# Patient Record
Sex: Female | Born: 1960 | Race: Black or African American | Hispanic: No | Smoking: Never smoker
Health system: Southern US, Community
[De-identification: ages and names within clinical notes are randomized; demographics above are authoritative.]

## PROBLEM LIST (undated history)

## (undated) DIAGNOSIS — M1990 Unspecified osteoarthritis, unspecified site: Secondary | ICD-10-CM

## (undated) DIAGNOSIS — F32A Depression, unspecified: Secondary | ICD-10-CM

## (undated) DIAGNOSIS — C50919 Malignant neoplasm of unspecified site of unspecified female breast: Secondary | ICD-10-CM

## (undated) DIAGNOSIS — I2699 Other pulmonary embolism without acute cor pulmonale: Secondary | ICD-10-CM

## (undated) DIAGNOSIS — Z923 Personal history of irradiation: Secondary | ICD-10-CM

## (undated) DIAGNOSIS — F419 Anxiety disorder, unspecified: Secondary | ICD-10-CM

## (undated) DIAGNOSIS — I509 Heart failure, unspecified: Secondary | ICD-10-CM

## (undated) DIAGNOSIS — I10 Essential (primary) hypertension: Secondary | ICD-10-CM

## (undated) DIAGNOSIS — F329 Major depressive disorder, single episode, unspecified: Secondary | ICD-10-CM

## (undated) DIAGNOSIS — J449 Chronic obstructive pulmonary disease, unspecified: Secondary | ICD-10-CM

## (undated) DIAGNOSIS — K219 Gastro-esophageal reflux disease without esophagitis: Secondary | ICD-10-CM

## (undated) DIAGNOSIS — E785 Hyperlipidemia, unspecified: Secondary | ICD-10-CM

## (undated) DIAGNOSIS — E119 Type 2 diabetes mellitus without complications: Secondary | ICD-10-CM

## (undated) DIAGNOSIS — Z9221 Personal history of antineoplastic chemotherapy: Secondary | ICD-10-CM

## (undated) DIAGNOSIS — J45909 Unspecified asthma, uncomplicated: Secondary | ICD-10-CM

## (undated) HISTORY — DX: Anxiety disorder, unspecified: F41.9

## (undated) HISTORY — DX: Essential (primary) hypertension: I10

## (undated) HISTORY — DX: Unspecified osteoarthritis, unspecified site: M19.90

## (undated) HISTORY — DX: Heart failure, unspecified: I50.9

## (undated) HISTORY — DX: Major depressive disorder, single episode, unspecified: F32.9

## (undated) HISTORY — DX: Unspecified asthma, uncomplicated: J45.909

## (undated) HISTORY — DX: Depression, unspecified: F32.A

## (undated) HISTORY — PX: ABDOMINAL HYSTERECTOMY: SHX81

## (undated) HISTORY — DX: Other pulmonary embolism without acute cor pulmonale: I26.99

## (undated) HISTORY — PX: TUBAL LIGATION: SHX77

## (undated) HISTORY — DX: Chronic obstructive pulmonary disease, unspecified: J44.9

## (undated) HISTORY — DX: Hyperlipidemia, unspecified: E78.5

## (undated) HISTORY — DX: Type 2 diabetes mellitus without complications: E11.9

## (undated) HISTORY — DX: Malignant neoplasm of unspecified site of unspecified female breast: C50.919

## (undated) HISTORY — DX: Gastro-esophageal reflux disease without esophagitis: K21.9

---

## 2001-12-03 HISTORY — PX: BREAST EXCISIONAL BIOPSY: SUR124

## 2004-12-03 HISTORY — PX: MASTECTOMY: SHX3

## 2004-12-03 HISTORY — PX: BREAST BIOPSY: SHX20

## 2004-12-26 ENCOUNTER — Emergency Department: Payer: Self-pay | Admitting: Emergency Medicine

## 2004-12-29 ENCOUNTER — Ambulatory Visit: Payer: Self-pay | Admitting: Oncology

## 2005-01-03 ENCOUNTER — Ambulatory Visit: Payer: Self-pay | Admitting: Oncology

## 2005-02-01 ENCOUNTER — Ambulatory Visit: Payer: Self-pay | Admitting: Oncology

## 2005-03-03 ENCOUNTER — Ambulatory Visit: Payer: Self-pay | Admitting: Oncology

## 2005-03-04 ENCOUNTER — Emergency Department: Payer: Self-pay | Admitting: Emergency Medicine

## 2005-03-09 ENCOUNTER — Ambulatory Visit: Payer: Self-pay | Admitting: Surgery

## 2005-04-23 ENCOUNTER — Inpatient Hospital Stay: Payer: Self-pay | Admitting: Surgery

## 2005-04-25 ENCOUNTER — Other Ambulatory Visit: Payer: Self-pay

## 2005-05-09 ENCOUNTER — Inpatient Hospital Stay: Payer: Self-pay | Admitting: Oncology

## 2005-05-16 ENCOUNTER — Ambulatory Visit: Payer: Self-pay

## 2005-05-16 ENCOUNTER — Ambulatory Visit: Payer: Self-pay | Admitting: Oncology

## 2005-06-02 ENCOUNTER — Ambulatory Visit: Payer: Self-pay

## 2005-06-02 ENCOUNTER — Ambulatory Visit: Payer: Self-pay | Admitting: Oncology

## 2005-06-11 ENCOUNTER — Emergency Department: Payer: Self-pay | Admitting: Surgery

## 2005-07-03 ENCOUNTER — Ambulatory Visit: Payer: Self-pay | Admitting: Oncology

## 2005-08-02 ENCOUNTER — Other Ambulatory Visit: Payer: Self-pay

## 2005-08-03 ENCOUNTER — Ambulatory Visit: Payer: Self-pay | Admitting: Oncology

## 2005-08-09 ENCOUNTER — Ambulatory Visit: Payer: Self-pay | Admitting: General Surgery

## 2005-09-02 ENCOUNTER — Ambulatory Visit: Payer: Self-pay | Admitting: Oncology

## 2005-10-03 ENCOUNTER — Ambulatory Visit: Payer: Self-pay | Admitting: Oncology

## 2005-11-02 ENCOUNTER — Ambulatory Visit: Payer: Self-pay | Admitting: Oncology

## 2005-12-03 ENCOUNTER — Ambulatory Visit: Payer: Self-pay | Admitting: Oncology

## 2006-01-03 ENCOUNTER — Ambulatory Visit: Payer: Self-pay | Admitting: Oncology

## 2006-01-21 ENCOUNTER — Emergency Department: Payer: Self-pay | Admitting: Emergency Medicine

## 2006-01-31 ENCOUNTER — Ambulatory Visit: Payer: Self-pay | Admitting: Oncology

## 2006-03-03 ENCOUNTER — Ambulatory Visit: Payer: Self-pay | Admitting: Oncology

## 2006-04-02 ENCOUNTER — Ambulatory Visit: Payer: Self-pay | Admitting: Oncology

## 2006-04-05 ENCOUNTER — Ambulatory Visit: Payer: Self-pay

## 2006-05-03 ENCOUNTER — Ambulatory Visit: Payer: Self-pay | Admitting: Oncology

## 2006-06-02 ENCOUNTER — Ambulatory Visit: Payer: Self-pay | Admitting: Oncology

## 2006-06-27 ENCOUNTER — Inpatient Hospital Stay: Payer: Self-pay | Admitting: Obstetrics and Gynecology

## 2006-07-03 ENCOUNTER — Ambulatory Visit: Payer: Self-pay | Admitting: Oncology

## 2006-07-09 ENCOUNTER — Ambulatory Visit: Payer: Self-pay | Admitting: Internal Medicine

## 2006-08-03 ENCOUNTER — Ambulatory Visit: Payer: Self-pay | Admitting: Oncology

## 2006-10-03 ENCOUNTER — Ambulatory Visit: Payer: Self-pay | Admitting: Oncology

## 2006-11-02 ENCOUNTER — Ambulatory Visit: Payer: Self-pay | Admitting: Oncology

## 2006-12-03 ENCOUNTER — Ambulatory Visit: Payer: Self-pay | Admitting: Oncology

## 2007-02-12 ENCOUNTER — Ambulatory Visit: Payer: Self-pay | Admitting: Oncology

## 2007-02-18 ENCOUNTER — Ambulatory Visit: Payer: Self-pay | Admitting: Internal Medicine

## 2007-03-04 ENCOUNTER — Ambulatory Visit: Payer: Self-pay | Admitting: Unknown Physician Specialty

## 2007-03-04 ENCOUNTER — Ambulatory Visit: Payer: Self-pay | Admitting: Oncology

## 2007-03-05 ENCOUNTER — Ambulatory Visit: Payer: Self-pay | Admitting: Unknown Physician Specialty

## 2007-05-13 ENCOUNTER — Ambulatory Visit: Payer: Self-pay | Admitting: Oncology

## 2007-05-15 ENCOUNTER — Ambulatory Visit: Payer: Self-pay | Admitting: Oncology

## 2007-06-03 ENCOUNTER — Ambulatory Visit: Payer: Self-pay | Admitting: Oncology

## 2007-08-04 ENCOUNTER — Ambulatory Visit: Payer: Self-pay | Admitting: Oncology

## 2007-08-15 ENCOUNTER — Ambulatory Visit: Payer: Self-pay | Admitting: Oncology

## 2007-09-03 ENCOUNTER — Ambulatory Visit: Payer: Self-pay | Admitting: Oncology

## 2007-09-24 ENCOUNTER — Ambulatory Visit: Payer: Self-pay | Admitting: Family Medicine

## 2007-11-03 ENCOUNTER — Ambulatory Visit: Payer: Self-pay | Admitting: Oncology

## 2007-11-07 ENCOUNTER — Ambulatory Visit: Payer: Self-pay | Admitting: Oncology

## 2007-12-04 ENCOUNTER — Ambulatory Visit: Payer: Self-pay | Admitting: Oncology

## 2008-01-04 ENCOUNTER — Ambulatory Visit: Payer: Self-pay | Admitting: Oncology

## 2008-04-02 ENCOUNTER — Ambulatory Visit: Payer: Self-pay | Admitting: Oncology

## 2008-07-03 ENCOUNTER — Ambulatory Visit: Payer: Self-pay | Admitting: Oncology

## 2008-12-14 ENCOUNTER — Ambulatory Visit: Payer: Self-pay | Admitting: Oncology

## 2009-02-10 ENCOUNTER — Encounter: Payer: Self-pay | Admitting: Internal Medicine

## 2009-03-03 ENCOUNTER — Encounter: Payer: Self-pay | Admitting: Internal Medicine

## 2009-05-03 ENCOUNTER — Ambulatory Visit: Payer: Self-pay | Admitting: Oncology

## 2009-05-30 ENCOUNTER — Ambulatory Visit: Payer: Self-pay | Admitting: Oncology

## 2009-06-02 ENCOUNTER — Ambulatory Visit: Payer: Self-pay | Admitting: Oncology

## 2009-07-01 ENCOUNTER — Ambulatory Visit: Payer: Self-pay | Admitting: Family Medicine

## 2009-07-03 ENCOUNTER — Ambulatory Visit: Payer: Self-pay | Admitting: Oncology

## 2009-12-03 ENCOUNTER — Ambulatory Visit: Payer: Self-pay | Admitting: Oncology

## 2009-12-15 ENCOUNTER — Ambulatory Visit: Payer: Self-pay | Admitting: Oncology

## 2009-12-29 ENCOUNTER — Ambulatory Visit: Payer: Self-pay | Admitting: Oncology

## 2010-01-03 ENCOUNTER — Ambulatory Visit: Payer: Self-pay | Admitting: Oncology

## 2010-05-03 ENCOUNTER — Ambulatory Visit: Payer: Self-pay | Admitting: Oncology

## 2010-05-10 ENCOUNTER — Ambulatory Visit: Payer: Self-pay | Admitting: Oncology

## 2010-06-02 ENCOUNTER — Ambulatory Visit: Payer: Self-pay | Admitting: Oncology

## 2010-07-03 ENCOUNTER — Ambulatory Visit: Payer: Self-pay | Admitting: Oncology

## 2011-03-21 ENCOUNTER — Emergency Department: Payer: Self-pay | Admitting: Emergency Medicine

## 2011-04-11 ENCOUNTER — Ambulatory Visit: Payer: Self-pay | Admitting: Family Medicine

## 2011-04-29 ENCOUNTER — Emergency Department: Payer: Self-pay | Admitting: Emergency Medicine

## 2011-05-25 ENCOUNTER — Ambulatory Visit: Payer: Self-pay | Admitting: Oncology

## 2011-05-26 LAB — CANCER ANTIGEN 27.29: CA 27.29: 14.9 U/mL (ref 0.0–38.6)

## 2011-06-03 ENCOUNTER — Ambulatory Visit: Payer: Self-pay | Admitting: Oncology

## 2011-07-04 ENCOUNTER — Ambulatory Visit: Payer: Self-pay | Admitting: Oncology

## 2011-08-02 ENCOUNTER — Ambulatory Visit: Payer: Self-pay | Admitting: Internal Medicine

## 2011-08-15 ENCOUNTER — Ambulatory Visit: Payer: Self-pay | Admitting: Internal Medicine

## 2011-12-25 ENCOUNTER — Ambulatory Visit: Payer: Self-pay | Admitting: Oncology

## 2011-12-25 LAB — CBC CANCER CENTER
Basophil #: 0.1 x10 3/mm (ref 0.0–0.1)
Eosinophil #: 0.2 x10 3/mm (ref 0.0–0.7)
HCT: 40.8 % (ref 35.0–47.0)
Lymphocyte %: 36 %
MCH: 25.8 pg — ABNORMAL LOW (ref 26.0–34.0)
MCHC: 33.3 g/dL (ref 32.0–36.0)
MCV: 78 fL — ABNORMAL LOW (ref 80–100)
Monocyte %: 6.8 %
Neutrophil #: 2.8 x10 3/mm (ref 1.4–6.5)
Platelet: 228 x10 3/mm (ref 150–440)
RDW: 15.5 % — ABNORMAL HIGH (ref 11.5–14.5)
WBC: 5.5 x10 3/mm (ref 3.6–11.0)

## 2011-12-25 LAB — COMPREHENSIVE METABOLIC PANEL
Alkaline Phosphatase: 106 U/L (ref 50–136)
Anion Gap: 6 — ABNORMAL LOW (ref 7–16)
BUN: 12 mg/dL (ref 7–18)
Bilirubin,Total: 0.5 mg/dL (ref 0.2–1.0)
Calcium, Total: 9.3 mg/dL (ref 8.5–10.1)
Chloride: 103 mmol/L (ref 98–107)
Co2: 32 mmol/L (ref 21–32)
Creatinine: 0.97 mg/dL (ref 0.60–1.30)
EGFR (African American): >60
EGFR (Non-African Amer.): >60
Glucose: 177 mg/dL — ABNORMAL HIGH (ref 65–99)
Osmolality: 285 (ref 275–301)
Potassium: 3.8 mmol/L (ref 3.5–5.1)
SGOT(AST): 15 U/L (ref 15–37)
Total Protein: 7.6 g/dL (ref 6.4–8.2)

## 2011-12-26 LAB — CANCER ANTIGEN 27.29: CA 27.29: 19.7 U/mL (ref 0.0–38.6)

## 2012-01-04 ENCOUNTER — Ambulatory Visit: Payer: Self-pay | Admitting: Oncology

## 2012-06-11 ENCOUNTER — Ambulatory Visit: Payer: Self-pay | Admitting: Gastroenterology

## 2012-06-19 ENCOUNTER — Ambulatory Visit: Payer: Self-pay | Admitting: Oncology

## 2012-06-25 ENCOUNTER — Ambulatory Visit: Payer: Self-pay | Admitting: Oncology

## 2012-06-25 LAB — COMPREHENSIVE METABOLIC PANEL
Albumin: 3.5 g/dL (ref 3.4–5.0)
Anion Gap: 6 — ABNORMAL LOW (ref 7–16)
Bilirubin,Total: 0.5 mg/dL (ref 0.2–1.0)
Calcium, Total: 9 mg/dL (ref 8.5–10.1)
Chloride: 103 mmol/L (ref 98–107)
EGFR (African American): >60
EGFR (Non-African Amer.): 54 — ABNORMAL LOW
Osmolality: 291 (ref 275–301)
Potassium: 3.9 mmol/L (ref 3.5–5.1)
Sodium: 140 mmol/L (ref 136–145)
Total Protein: 7.6 g/dL (ref 6.4–8.2)

## 2012-06-25 LAB — CBC CANCER CENTER
Basophil %: 0.8 %
Eosinophil #: 0.2 x10 3/mm (ref 0.0–0.7)
Eosinophil %: 3.5 %
HCT: 40.6 % (ref 35.0–47.0)
Lymphocyte %: 39.6 %
Monocyte %: 6.6 %
Neutrophil #: 3.1 x10 3/mm (ref 1.4–6.5)
Neutrophil %: 49.5 %
Platelet: 221 x10 3/mm (ref 150–440)
RBC: 5.19 10*6/uL (ref 3.80–5.20)
WBC: 6.4 x10 3/mm (ref 3.6–11.0)

## 2012-06-26 LAB — CANCER ANTIGEN 27.29: CA 27.29: 18.7 U/mL (ref 0.0–38.6)

## 2012-07-03 ENCOUNTER — Ambulatory Visit: Payer: Self-pay | Admitting: Oncology

## 2012-12-03 ENCOUNTER — Ambulatory Visit: Payer: Self-pay | Admitting: Oncology

## 2013-07-03 ENCOUNTER — Ambulatory Visit: Payer: Self-pay | Admitting: Oncology

## 2013-07-15 ENCOUNTER — Ambulatory Visit: Payer: Self-pay | Admitting: Oncology

## 2013-07-21 LAB — COMPREHENSIVE METABOLIC PANEL
Albumin: 3.7 g/dL (ref 3.4–5.0)
Anion Gap: 6 — ABNORMAL LOW (ref 7–16)
BUN: 24 mg/dL — ABNORMAL HIGH (ref 7–18)
Chloride: 99 mmol/L (ref 98–107)
Co2: 31 mmol/L (ref 21–32)
EGFR (Non-African Amer.): 43 — ABNORMAL LOW
Glucose: 293 mg/dL — ABNORMAL HIGH (ref 65–99)
Osmolality: 287 (ref 275–301)
Potassium: 4.3 mmol/L (ref 3.5–5.1)
SGOT(AST): 15 U/L (ref 15–37)
SGPT (ALT): 22 U/L (ref 12–78)
Sodium: 136 mmol/L (ref 136–145)
Total Protein: 7.6 g/dL (ref 6.4–8.2)

## 2013-07-21 LAB — CBC CANCER CENTER
HCT: 41.3 % (ref 35.0–47.0)
Lymphocyte %: 45.1 %
MCHC: 33.7 g/dL (ref 32.0–36.0)
MCV: 77 fL — ABNORMAL LOW (ref 80–100)
Monocyte #: 0.5 x10 3/mm (ref 0.2–0.9)
Platelet: 245 x10 3/mm (ref 150–440)
RBC: 5.37 10*6/uL — ABNORMAL HIGH (ref 3.80–5.20)
WBC: 8.6 x10 3/mm (ref 3.6–11.0)

## 2013-07-22 LAB — CANCER ANTIGEN 27.29: CA 27.29: 16.4 U/mL (ref 0.0–38.6)

## 2013-08-03 ENCOUNTER — Ambulatory Visit: Payer: Self-pay | Admitting: Oncology

## 2013-11-27 ENCOUNTER — Emergency Department: Payer: Self-pay | Admitting: Emergency Medicine

## 2013-11-27 LAB — URINALYSIS, COMPLETE
Blood: NEGATIVE
Ketone: NEGATIVE
Leukocyte Esterase: NEGATIVE
Nitrite: NEGATIVE
Ph: 7 (ref 4.5–8.0)
Protein: NEGATIVE
RBC,UR: <1 /HPF (ref 0–5)
Squamous Epithelial: 1

## 2013-11-27 LAB — COMPREHENSIVE METABOLIC PANEL
Albumin: 3.4 g/dL (ref 3.4–5.0)
Alkaline Phosphatase: 70 U/L
Anion Gap: 5 — ABNORMAL LOW (ref 7–16)
BUN: 15 mg/dL (ref 7–18)
Calcium, Total: 8.8 mg/dL (ref 8.5–10.1)
Co2: 28 mmol/L (ref 21–32)
Creatinine: 1 mg/dL (ref 0.60–1.30)
EGFR (Non-African Amer.): >60
Osmolality: 281 (ref 275–301)
SGPT (ALT): 23 U/L (ref 12–78)
Sodium: 138 mmol/L (ref 136–145)
Total Protein: 7.3 g/dL (ref 6.4–8.2)

## 2013-11-27 LAB — CBC WITH DIFFERENTIAL/PLATELET
Basophil #: 0.1 10*3/uL (ref 0.0–0.1)
Eosinophil #: 0.2 10*3/uL (ref 0.0–0.7)
HGB: 12.8 g/dL (ref 12.0–16.0)
Lymphocyte %: 24.5 %
MCH: 25.9 pg — ABNORMAL LOW (ref 26.0–34.0)
Monocyte #: 0.6 x10 3/mm (ref 0.2–0.9)
Neutrophil #: 5.3 10*3/uL (ref 1.4–6.5)
Platelet: 202 10*3/uL (ref 150–440)
RBC: 4.92 10*6/uL (ref 3.80–5.20)

## 2013-11-27 LAB — LIPASE, BLOOD: Lipase: 93 U/L (ref 73–393)

## 2014-03-30 ENCOUNTER — Emergency Department: Payer: Self-pay | Admitting: Emergency Medicine

## 2014-03-30 LAB — BASIC METABOLIC PANEL
Anion Gap: 8 (ref 7–16)
BUN: 12 mg/dL (ref 7–18)
Calcium, Total: 9 mg/dL (ref 8.5–10.1)
Chloride: 103 mmol/L (ref 98–107)
Co2: 29 mmol/L (ref 21–32)
Creatinine: 1.05 mg/dL (ref 0.60–1.30)
EGFR (Non-African Amer.): >60
Glucose: 177 mg/dL — ABNORMAL HIGH (ref 65–99)
OSMOLALITY: 284 (ref 275–301)
Potassium: 3.5 mmol/L (ref 3.5–5.1)
SODIUM: 140 mmol/L (ref 136–145)

## 2014-03-30 LAB — CBC WITH DIFFERENTIAL/PLATELET
BASOS ABS: 0.1 10*3/uL (ref 0.0–0.1)
Basophil %: 0.7 %
Eosinophil #: 0.3 10*3/uL (ref 0.0–0.7)
Eosinophil %: 4.1 %
HCT: 40.9 % (ref 35.0–47.0)
HGB: 13.3 g/dL (ref 12.0–16.0)
Lymphocyte #: 2.4 10*3/uL (ref 1.0–3.6)
Lymphocyte %: 30.2 %
MCH: 25.7 pg — ABNORMAL LOW (ref 26.0–34.0)
MCHC: 32.6 g/dL (ref 32.0–36.0)
MCV: 79 fL — ABNORMAL LOW (ref 80–100)
Monocyte #: 0.7 x10 3/mm (ref 0.2–0.9)
Monocyte %: 8.6 %
NEUTROS ABS: 4.5 10*3/uL (ref 1.4–6.5)
Neutrophil %: 56.4 %
Platelet: 227 10*3/uL (ref 150–440)
RBC: 5.18 10*6/uL (ref 3.80–5.20)
RDW: 15.7 % — ABNORMAL HIGH (ref 11.5–14.5)
WBC: 8 10*3/uL (ref 3.6–11.0)

## 2014-04-19 ENCOUNTER — Ambulatory Visit: Payer: Self-pay | Admitting: Family Medicine

## 2014-07-01 ENCOUNTER — Inpatient Hospital Stay: Payer: Self-pay | Admitting: Internal Medicine

## 2014-07-01 LAB — HEPATIC FUNCTION PANEL A (ARMC)
Albumin: 2.8 g/dL — ABNORMAL LOW (ref 3.4–5.0)
Alkaline Phosphatase: 75 U/L
Bilirubin, Direct: <0.1 mg/dL (ref 0.00–0.20)
Bilirubin,Total: 0.4 mg/dL (ref 0.2–1.0)
SGOT(AST): 40 U/L — ABNORMAL HIGH (ref 15–37)
SGPT (ALT): 22 U/L
TOTAL PROTEIN: 6.9 g/dL (ref 6.4–8.2)

## 2014-07-01 LAB — BASIC METABOLIC PANEL
Anion Gap: 16 (ref 7–16)
BUN: 15 mg/dL (ref 7–18)
CO2: 17 mmol/L — AB (ref 21–32)
Calcium, Total: 8.2 mg/dL — ABNORMAL LOW (ref 8.5–10.1)
Chloride: 105 mmol/L (ref 98–107)
Creatinine: 1.49 mg/dL — ABNORMAL HIGH (ref 0.60–1.30)
EGFR (Non-African Amer.): 40 — ABNORMAL LOW
GFR CALC AF AMER: 46 — AB
Glucose: 388 mg/dL — ABNORMAL HIGH (ref 65–99)
OSMOLALITY: 293 (ref 275–301)
POTASSIUM: 4.8 mmol/L (ref 3.5–5.1)
Sodium: 138 mmol/L (ref 136–145)

## 2014-07-01 LAB — CBC
HCT: 42.2 % (ref 35.0–47.0)
HGB: 12.9 g/dL (ref 12.0–16.0)
MCH: 25.1 pg — AB (ref 26.0–34.0)
MCHC: 30.6 g/dL — AB (ref 32.0–36.0)
MCV: 82 fL (ref 80–100)
Platelet: 262 10*3/uL (ref 150–440)
RBC: 5.15 10*6/uL (ref 3.80–5.20)
RDW: 15.8 % — AB (ref 11.5–14.5)
WBC: 15.7 10*3/uL — AB (ref 3.6–11.0)

## 2014-07-01 LAB — CK-MB
CK-MB: 7.5 ng/mL — ABNORMAL HIGH (ref 0.5–3.6)
CK-MB: 11.1 ng/mL — ABNORMAL HIGH (ref 0.5–3.6)

## 2014-07-01 LAB — PROTIME-INR
INR: 1.1
Prothrombin Time: 13.6 secs (ref 11.5–14.7)

## 2014-07-01 LAB — TROPONIN I
TROPONIN-I: 0.28 ng/mL — AB
TROPONIN-I: 0.92 ng/mL — AB
Troponin-I: 1.5 ng/mL — ABNORMAL HIGH

## 2014-07-01 LAB — PRO B NATRIURETIC PEPTIDE: B-Type Natriuretic Peptide: 925 pg/mL — ABNORMAL HIGH (ref 0–125)

## 2014-07-01 LAB — HEPARIN LEVEL (UNFRACTIONATED): Anti-Xa(Unfractionated): 0.39 IU/mL (ref 0.30–0.70)

## 2014-07-01 LAB — APTT: ACTIVATED PTT: 25.1 s (ref 23.6–35.9)

## 2014-07-02 LAB — CBC WITH DIFFERENTIAL/PLATELET
Basophil #: 0.1 10*3/uL (ref 0.0–0.1)
Basophil %: 0.8 %
EOS ABS: 0 10*3/uL (ref 0.0–0.7)
Eosinophil %: 0 %
HCT: 41.4 % (ref 35.0–47.0)
HGB: 13.2 g/dL (ref 12.0–16.0)
LYMPHS ABS: 2.1 10*3/uL (ref 1.0–3.6)
LYMPHS PCT: 11.9 %
MCH: 25.2 pg — ABNORMAL LOW (ref 26.0–34.0)
MCHC: 32 g/dL (ref 32.0–36.0)
MCV: 79 fL — AB (ref 80–100)
MONOS PCT: 2.1 %
Monocyte #: 0.4 x10 3/mm (ref 0.2–0.9)
Neutrophil #: 15.3 10*3/uL — ABNORMAL HIGH (ref 1.4–6.5)
Neutrophil %: 85.2 %
Platelet: 248 10*3/uL (ref 150–440)
RBC: 5.25 10*6/uL — ABNORMAL HIGH (ref 3.80–5.20)
RDW: 15.5 % — AB (ref 11.5–14.5)
WBC: 18 10*3/uL — ABNORMAL HIGH (ref 3.6–11.0)

## 2014-07-02 LAB — BASIC METABOLIC PANEL
ANION GAP: 8 (ref 7–16)
BUN: 18 mg/dL (ref 7–18)
CHLORIDE: 103 mmol/L (ref 98–107)
CO2: 27 mmol/L (ref 21–32)
Calcium, Total: 8.2 mg/dL — ABNORMAL LOW (ref 8.5–10.1)
Creatinine: 1.07 mg/dL (ref 0.60–1.30)
EGFR (Non-African Amer.): 59 — ABNORMAL LOW
Glucose: 239 mg/dL — ABNORMAL HIGH (ref 65–99)
OSMOLALITY: 285 (ref 275–301)
Potassium: 4.3 mmol/L (ref 3.5–5.1)
SODIUM: 138 mmol/L (ref 136–145)

## 2014-07-02 LAB — HEPARIN LEVEL (UNFRACTIONATED): ANTI-XA(UNFRACTIONATED): 0.41 [IU]/mL (ref 0.30–0.70)

## 2014-07-02 LAB — CK-MB: CK-MB: 12.2 ng/mL — ABNORMAL HIGH (ref 0.5–3.6)

## 2014-07-03 LAB — CBC WITH DIFFERENTIAL/PLATELET
Basophil #: 0.1 10*3/uL (ref 0.0–0.1)
Basophil %: 0.7 %
EOS ABS: 0 10*3/uL (ref 0.0–0.7)
EOS PCT: 0.4 %
HCT: 37.2 % (ref 35.0–47.0)
HGB: 11.8 g/dL — ABNORMAL LOW (ref 12.0–16.0)
LYMPHS ABS: 2.7 10*3/uL (ref 1.0–3.6)
Lymphocyte %: 21.3 %
MCH: 24.9 pg — ABNORMAL LOW (ref 26.0–34.0)
MCHC: 31.6 g/dL — AB (ref 32.0–36.0)
MCV: 79 fL — ABNORMAL LOW (ref 80–100)
MONO ABS: 1 x10 3/mm — AB (ref 0.2–0.9)
Monocyte %: 7.8 %
NEUTROS ABS: 8.7 10*3/uL — AB (ref 1.4–6.5)
Neutrophil %: 69.8 %
Platelet: 205 10*3/uL (ref 150–440)
RBC: 4.73 10*6/uL (ref 3.80–5.20)
RDW: 15.4 % — AB (ref 11.5–14.5)
WBC: 12.5 10*3/uL — ABNORMAL HIGH (ref 3.6–11.0)

## 2014-07-19 ENCOUNTER — Encounter: Payer: Self-pay | Admitting: Internal Medicine

## 2014-07-20 ENCOUNTER — Ambulatory Visit: Payer: Self-pay | Admitting: Oncology

## 2014-07-27 ENCOUNTER — Ambulatory Visit: Payer: Self-pay | Admitting: Oncology

## 2014-07-27 LAB — COMPREHENSIVE METABOLIC PANEL
ALK PHOS: 64 U/L
ANION GAP: 8 (ref 7–16)
AST: 14 U/L — AB (ref 15–37)
Albumin: 3.7 g/dL (ref 3.4–5.0)
BILIRUBIN TOTAL: 0.3 mg/dL (ref 0.2–1.0)
BUN: 14 mg/dL (ref 7–18)
CHLORIDE: 103 mmol/L (ref 98–107)
CO2: 30 mmol/L (ref 21–32)
CREATININE: 1.05 mg/dL (ref 0.60–1.30)
Calcium, Total: 9 mg/dL (ref 8.5–10.1)
EGFR (Non-African Amer.): >60
Glucose: 161 mg/dL — ABNORMAL HIGH (ref 65–99)
OSMOLALITY: 285 (ref 275–301)
Potassium: 4.1 mmol/L (ref 3.5–5.1)
SGPT (ALT): 19 U/L
Sodium: 141 mmol/L (ref 136–145)
Total Protein: 7.6 g/dL (ref 6.4–8.2)

## 2014-07-27 LAB — CBC CANCER CENTER
Basophil #: 0.1 x10 3/mm (ref 0.0–0.1)
Basophil %: 0.9 %
Eosinophil #: 0.2 x10 3/mm (ref 0.0–0.7)
Eosinophil %: 3.3 %
HCT: 37.6 % (ref 35.0–47.0)
HGB: 12 g/dL (ref 12.0–16.0)
Lymphocyte #: 2.4 x10 3/mm (ref 1.0–3.6)
Lymphocyte %: 38.6 %
MCH: 25.2 pg — ABNORMAL LOW (ref 26.0–34.0)
MCHC: 32 g/dL (ref 32.0–36.0)
MCV: 79 fL — ABNORMAL LOW (ref 80–100)
Monocyte #: 0.5 x10 3/mm (ref 0.2–0.9)
Monocyte %: 8 %
Neutrophil #: 3 x10 3/mm (ref 1.4–6.5)
Neutrophil %: 49.2 %
Platelet: 232 x10 3/mm (ref 150–440)
RBC: 4.76 10*6/uL (ref 3.80–5.20)
RDW: 15.4 % — ABNORMAL HIGH (ref 11.5–14.5)
WBC: 6.2 x10 3/mm (ref 3.6–11.0)

## 2014-07-28 LAB — CANCER ANTIGEN 27.29: CA 27.29: 24.3 U/mL (ref 0.0–38.6)

## 2014-08-03 ENCOUNTER — Ambulatory Visit: Payer: Self-pay | Admitting: Oncology

## 2014-08-03 ENCOUNTER — Encounter: Payer: Self-pay | Admitting: Internal Medicine

## 2014-08-10 ENCOUNTER — Ambulatory Visit: Payer: Self-pay | Admitting: Family

## 2014-09-02 ENCOUNTER — Encounter: Payer: Self-pay | Admitting: Internal Medicine

## 2014-09-17 ENCOUNTER — Ambulatory Visit: Payer: Self-pay | Admitting: Family

## 2014-10-03 ENCOUNTER — Encounter: Payer: Self-pay | Admitting: Internal Medicine

## 2014-10-19 ENCOUNTER — Ambulatory Visit: Payer: Self-pay | Admitting: Family

## 2014-11-02 ENCOUNTER — Ambulatory Visit: Payer: Self-pay | Admitting: Family

## 2014-11-02 ENCOUNTER — Encounter: Payer: Self-pay | Admitting: Internal Medicine

## 2014-11-17 ENCOUNTER — Ambulatory Visit: Payer: Self-pay | Admitting: Family

## 2014-12-03 ENCOUNTER — Encounter: Payer: Self-pay | Admitting: Internal Medicine

## 2015-01-28 ENCOUNTER — Ambulatory Visit: Payer: Self-pay | Admitting: Family

## 2015-03-11 ENCOUNTER — Ambulatory Visit
Admit: 2015-03-11 | Disposition: A | Discharge status: Home / Self Care | Payer: Self-pay | Attending: Family | Admitting: Family

## 2015-03-18 ENCOUNTER — Other Ambulatory Visit: Payer: Self-pay | Admitting: Oncology

## 2015-03-18 DIAGNOSIS — Z129 Encounter for screening for malignant neoplasm, site unspecified: Secondary | ICD-10-CM

## 2015-03-25 DIAGNOSIS — I5022 Chronic systolic (congestive) heart failure: Secondary | ICD-10-CM | POA: Insufficient documentation

## 2015-03-25 DIAGNOSIS — I951 Orthostatic hypotension: Secondary | ICD-10-CM

## 2015-03-25 DIAGNOSIS — E119 Type 2 diabetes mellitus without complications: Secondary | ICD-10-CM | POA: Insufficient documentation

## 2015-03-26 NOTE — Consult Note (Signed)
PATIENT NAME:  Melissa Harrell, Melissa Harrell MR#:  [id number] DATE OF BIRTH:  Apr 02, 1961  DATE OF CONSULTATION:  07/01/2014  CONSULTING PHYSICIAN:  Vivek J. Verdell Carmine, MD.  REASON FOR CONSULTATION: Acute systolic dysfunction, congestive heart failure with subendocardial myocardial infarction, diabetes, hypertension, and hyperlipidemia.   CHIEF COMPLAINT: "I got short of breath."   HISTORY OF PRESENT ILLNESS:  This is a 54 year old female with diabetes, hypertension, hyperlipidemia with acute onset of severe shortness of breath, abdominal discomfort, weakness and fatigue with elevated troponin of 0.28, elevated BNP of 924 and pulmonary edema by chest x-ray. The patient was given BiPAP with improvements and intravenous Lasix with improvements with chronic kidney disease showing a creatinine of 1.4. She has had an echocardiogram showing severe segmental LV systolic dysfunction, ejection fraction of 25% consistent with concerns of severe acute congestive heart failure and subendocardial myocardial infarction. The patient now has improvements with oxygenation.   REVIEW OF SYSTEMS: Negative for skin lesions, skin rashes, frequent urination, urination at night, muscle weakness, numbness, anxiety, depression, syncope, dizziness, nausea, diaphoresis, vision change, ringing in the ears, hearing loss, cough, heartburn, nausea, vomiting.  PAST MEDICAL HISTORY: 1.  Diabetes.  2.  Hypertension.  3.  Hyperlipidemia. 4.  Chronic kidney disease.   FAMILY HISTORY: Father had liver cirrhosis, but no early onset of cardiovascular disease.   SOCIAL HISTORY:  The patient currently denies alcohol or tobacco use.   ALLERGIES: To medications are listed.   PHYSICAL EXAMINATION: VITAL SIGNS: Blood pressure is 122/68 bilateral, heart rate is 78 upright, reclining, and slightly irregular.  GENERAL: She is a well-appearing elderly female in no acute distress.   HEENT:No icterus thyromegaly ulcers, hemorrhage or xanthelasma.   CARDIOVASCULAR: Regular rate and rhythm with normal S1 and S2 with a summation gallop.  PMI enlarged and laterally displaced. Carotid upstroke normal with bilateral elevation of jugular venous pressure.  LUNGS: Bibasilar crackles with more  respirations.  ABDOMEN:  Soft, nontender with some increased abdominal girth, but no evidence of bruit.  EXTREMITIES: 2+ radial, femoral, dorsal pedal pulses, with 1+ lower extremity edema. No cyanosis, clubbing or ulcers.  NEUROLOGIC: The patient is oriented to time, place, and person, with normal mood and affect.   ASSESSMENT: A 54 year old female with diabetes, hypertension, hyperlipidemia with acute systolic dysfunction, congestive heart failure with LV systolic dysfunction by echocardiogram and elevated troponin consistent with subendocardial myocardial infarction and chronic kidney disease.   RECOMMENDATIONS: 1.  Oxygen and Lasix for acute systolic dysfunction heart failure.  2.  Serial ECG and enzymes to assess extent of myocardial infarction.  3.  Proceed to cardiac catheterization with right and left heart catheterization for assessment of causes of acute congestive heart failure and LV systolic dysfunction. 4.  Beta blocker and ACE inhibitor as able for acute congestive heart failure.  5.  Further diagnostic testing and treatment as per above.    ____________________________ Corey Skains, MD bjk:ds D: 07/01/2014 17:50:51 ET T: 07/01/2014 18:15:56 ET JOB#: [id number]  cc: Corey Skains, MD, <Dictator> Corey Skains MD ELECTRONICALLY SIGNED 07/03/2014 7:50

## 2015-03-26 NOTE — H&P (Signed)
PATIENT NAME:  Melissa Harrell, Melissa Harrell MR#:  [id number] DATE OF BIRTH:  1961-10-04  DATE OF ADMISSION:  07/01/2014  PRIMARY CARE PHYSICIAN: [facility].   CHIEF COMPLAINT: Chest pain, shortness of breath, and nausea and vomiting.   HISTORY OF PRESENT ILLNESS: a 54 year old female who presented to the hospital with acute onset of chest pain, shortness of breath, nausea and vomiting that began earlier this morning. The patient was in her usual state of health with no complaints until early this morning. She continues to now complain of some mild abdominal pain with ongoing nausea and vomiting. She has had multiple episodes of vomiting while in the Emergency Room, and also had 1 bout of diarrhea. She presently denies any chest pain, but admits to some shortness of breath. She was initially put on some BIPAP, but could not tolerate it, and is currently weaned off of it on some minimal nasal cannula and is saturating well. On blood work, the patient was noted to have a mildly elevated troponin. She was also noted to be in mild CHF. Hospitalist services were contacted for further treatment and evaluation.   REVIEW OF SYSTEMS:  CONSTITUTIONAL: No documented fever. No weight gain or weight loss.  EYES: No blurred or double vision.  ENT: No tinnitus or postnasal drip. No redness of the oropharynx.  RESPIRATORY: No cough, no wheeze, no hemoptysis. Positive dyspnea.  CARDIOVASCULAR: Positive chest pain. No orthopnea. No palpitations. No syncope.  GASTROINTESTINAL: Positive nausea. Positive vomiting. Positive diarrhea. Positive generalized abdominal pain. No melena or hematochezia.  GENITOURINARY: No dysuria or hematuria.  ENDOCRINE: No polyuria or nocturia. No heat or cold intolerance. HEMATOLOGIC: No anemia, bruising or bleeding.  INTEGUMENTARY: No rashes or lesions.  MUSCULOSKELETAL: No arthritis. No swelling. No gout.  NEUROLOGIC: No numbness, tingling. No ataxia. No seizure activity.  PSYCHIATRIC:  Positive anxiety, no insomnia. No ADD.   PAST MEDICAL HISTORY: Consistent with hypertension, hyperlipidemia, diabetes, history of urinary incontinence, history of breast cancer in remission, GERD.   ALLERGIES: PENICILLIN, SULFA DRUGS, AND VICODIN, ALL OF WHICH CAUSE HIVES.   SOCIAL HISTORY: Does not smoke, does drink occasionally. No illicit drug abuse. Lives by herself.   FAMILY HISTORY: Father is deceased, died from liver cirrhosis. Mother is alive, has diabetes and heart problems.   CURRENT MEDICATIONS: Aspirin 81 mg daily, calcium and vitamin D 1 tablet b.i.d., Celexa 20 mg daily, clonidine 0.3 mg b.i.d., glipizide XL 10 mg b.i.d., loratadine 10 mg daily, metformin 5 mg 2 tablets daily, metoprolol tartrate 50 mg b.i.d., omeprazole 20 mg b.i.d., oxybutynin 5 mg b.i.d., albuterol inhaler 2 puffs 4 times daily as needed, quinapril 40 mg b.i.d., simvastatin 40 mg at bedtime.   PHYSICAL EXAMINATION:  VITAL SIGNS: Temperature is 98.1, pulse 100, respirations 22, blood pressure 116/92 , saturations 100% on 2 liters nasal cannula.  GENERAL: She is a pleasant-appearing female in mild respiratory distress.  HEENT: Atraumatic, normocephalic. Extraocular muscles are intact. Pupils equal and reactive to light. Sclerae anicteric. No conjunctival injection. No pharyngeal erythema.  NECK: Supple. There is no jugular venous distention. No bruits. No lymphadenopathy or thyromegaly.  HEART: Regular rate and rhythm, tachycardic. No murmurs, rubs or clicks.  LUNGS: She has some coarse rhonchi bilaterally, otherwise negative use of accessory muscles. No dullness to percussion. Good air entry bilaterally.  ABDOMEN: Soft, flat, nontender, nondistended. Has good bowel sounds. No hepatosplenomegaly appreciated.  EXTREMITIES: No evidence of any cyanosis, clubbing. Does have +1 pitting edema from the knees to the ankles bilaterally.  No cyanosis, no clubbing, +2 pedal and radial pulses bilaterally.  NEUROLOGICAL: The  patient is alert, awake, and oriented x 3, with no focal motor or sensory deficits appreciated bilaterally.  SKIN: Moist and warm with no rashes appreciated.  LYMPHATIC: There is no cervical lymphadenopathy.   LABORATORY DATA: Serum glucose 388, BUN 15, creatinine 1.49, sodium 138, potassium 4.8, chloride 105, bicarbonate 17. LFTs are within normal limits. Troponin 0.28. White cell count of 15.7, hemoglobin 12.9, hematocrit 42.2, platelet count of 262,000. INR is 1.1.   ABG showed a pH of 7.23, pCO2 of 48, pO2 of 135.  sPO2 100%. Lactic acid of 2.7.   The patient did have a CT of the chest, abdomen and pelvis done with and without contrast, which showed a tiny subsegmental size nonocclusive embolus in the right lower lobe. This is unlikely to be the source of the patient's symptoms. No larger Pulmonary embolus identified, widespread gound glass attenuation, air space disease, and intralobular septal thickening throughout the lungs bilaterally. This is favored to reflect pulmonary edema. Colonic diverticulosis without diverticulitis. Normal appendix.   ASSESSMENT AND PLAN: This is a 54 year old female with history of breast cancer, diabetes, hypertension, anxiety, hyperlipidemia, history of urinary incontinence, who presents to the hospital with shortness of breath and noted to have an elevated troponin.   1. Shortness of breath. I suspect this is likely due to mild congestive heart failure. The patient's CT chest does show a small pulmonary embolism, but unlikely that is the source of the patient's shortness of breath. Her most likely cause is congestive heart failure. We will diurese her with IV Lasix, follow intake and output and daily weights. I will continue her beta blocker. Hold ACE inhibitor for now. Check a 2-dimensional echocardiogram, get a cardiology consult and follow her clinically.  2. Elevated troponin. This is likely in the setting of demand ischemia versus non-ST-elevation myocardial  infarction. The patient did have chest pain. For now, we will continue treatment with heparin drip, aspirin, beta blocker and a statin. I will get a cardiology consult. Follow serial cardiac markers, keep on telemetry, check a 2-dimensional echocardiogram. 3. Nausea, vomiting. I suspect this is due to an ileus, as the patient's abdomen is distended with hypoactive bowel sounds. Her CT although does not show any evidence of ileus or small bowel obstruction. This is more of a clinical diagnosis. I recommended putting in an NG tube for decompression, although the patient refused. For now, we will continue supportive care with antiemetics and pain control.  4. Hyperlipidemia. Continue simvastatin.  5. Diabetes. Continue sliding scale insulin. Hold glipizide and metformin.  6. Urinary incontinence, continue oxybutynin.  7. Hypertension. The patient is presently hemodynamically stable. I will continue the clonidine and metoprolol for now.  8. Metabolic acidosis. This is likely secondary to lactic acidosis, and we will go ahead and follow. Follow serial ABGs.   The patient is a Full Code.   TIME SPENT ON ADMISSION: 50 minutes    ____________________________ Belia Heman. Verdell Carmine, MD vjs:jr D: 07/01/2014 14:47:10 ET T: 07/01/2014 15:21:24 ET JOB#: [id number]  cc: Belia Heman. Verdell Carmine, MD, <Dictator> Henreitta Leber MD ELECTRONICALLY SIGNED 07/16/2014 14:20

## 2015-03-26 NOTE — Discharge Summary (Signed)
PATIENT NAME:  Melissa Harrell, Melissa Harrell MR#:  [id number] DATE OF BIRTH:  [birth year]  DATE OF ADMISSION:  07/01/2014 DATE OF DISCHARGE:  07/03/2014  PRIMARY CARE PHYSICIAN:  [facility].  ADMITTING DIAGNOSES: 1.  Shortness of breath secondary to acute mild congestive heart failure.  2. Elevated troponin, nausea and vomiting.  PRIMARY DISCHARGE DIAGNOSES: 1.  Acute on chronic systolic congestive heart failure.  2.  Elevated troponin secondary to minimal subendocardial myocardial infarction, status post cardiac catheterization which has revealed normal coronary arteries and severe left ventricular dysfunction with ejection fraction 30%,  3.  Nausea and vomiting are resolved.  SECONDARY DISCHARGE DIAGNOSES: 1.  Hyperlipidemia. Continue simvastatin. 2.  Diabetes mellitus, noninsulin requiring.  3.  Chronic  urinary incontinence. Continue oxybutynin. 4.  Hypertension.   CONSULTATION: [facility] cardiology. The patient was evaluated by Dr. Nehemiah Massed.   The patient's shortness of breath was thought to be from acute on chronic congestive heart failure. An echocardiogram has revealed an ejection fraction of 20%. The patient subsequently had a cardiac which has revealed normal coronary arteries with no blockages. They have recommended to continue Lasix, beta blocker and ACE inhibitor. The patient is to follow up with Dr. Nehemiah Massed in a week.   PROCEDURES:  Cardiac catheterization, which revealed no blockages.   BRIEF HISTORY AND HOSPITAL COURSE: The patient is a 54 year old female who presented to the hospital with acute onset of chest pain, shortness of breath and nausea and vomiting on July 30. The patient was also complaining of some abdominal pain with ongoing nausea and vomiting. The patient is brought into the ED. The patient was initially placed on BiPAP, as she was complaining of shortness of breath. The patient could not tolerate and it was eventually weaned off. She was admitted to the hospital with the  chief diagnosis of mild CHF and elevated troponin was assumed to be from demand ischemia.   1.  During the hospital course for shortness of breath, she was treated with IV Lasix, beta blockers were continued, but ACE inhibitor was held in view of renal insufficiency and low blood pressure. Subsequently, her shortness of breath is significantly improved and cardiology has recommended to continue Lasix, beta blocker and ACE inhibitor. Echocardiogram has revealed left ventricular ejection fraction 20%. The patient was evaluated by [facility] Clinic nurse and the plan is to continue with [facility]F Clinic as an outpatient and the patient is to continue 2 liters of oxygen via nasal cannula. She will have to monitor her daily weights.  2.  Elevated troponin. It was probably from demand ischemia. Cardiology consult was placed, although the differential was acute subendocardial MI. The patient was placed heparin drip, aspirin, beta blocker and statin. Serial cardiac biomarkers were monitored and the patient had cardiac catheterization done which has revealed an ejection fraction of 50%. No blockages were noticed and no further interventions were recommended by cardiology. During my examination, the patient is resting comfortably and ambulating without any difficulties. The patient is discharged home. She has to follow up with Dr. Nehemiah Massed in a week.  3.  Nausea and vomiting is resolved with antinausea medication. The patient was given IV fluids.  4.  Hyperlipidemia.  Plan is to continue simvastatin.  5.  Diabetes mellitus. Her glipizide and metformin are resumed. 6.  Urinary  incontinence. Plan is to continue oxybutynin.  7.  Hypertension. Continue clonidine p.o. b.i.d. and metoprolol.   LABORATORIES FINDINGS:  1.  CT angiogram has revealed a tiny subsegmental sized nonocclusive embolus to  the right lower lobe. This is unlikely to be a source of symptoms in this patient. No  clinically relevant central,  lobar or segmental  sized embolus is identified.  2.  There is wide spread ground-glass attenuation airspace disease and interlobular septal thickening throughout the lungs bilaterally, which would reflect pulmonary edema, particularly in light of the cardiomegaly.  Clinical correlation for signs and symptoms of multilobar pneumonia or diffuse alveolar hemorrhage is recommended.  3.  Colonic diverticulosis without findings to suggest acute diverticulitis at this time.  4.  Normal appendix.  5.  Echocardiogram has a left ventricular ejection fraction by visual estimation is 20% to 25%.  6.  Severely decreased global left ventricular systolic function.  7.  Multiple segmental abnormalities.  8.  Moderately increased left ventricular internal cavity.   9.  Mildly dilated left atrium and right atrium, moderate mitral valve regurgitation and tricuspid regurgitation. Mildly increased left ventricular posterior wall thickness. Mildly elevated pulmonary artery systolic pressure.   The patient's white count is at 12.5, hemoglobin 11.8, hematocrit 37.8, and platelets are at 205,000.  The patient's  CPK-MB 7.5, 11.1 and 12.2. Troponin 0.28, 0.92 and 1.50.   CONDITION: At the time of discharge stable.   DISPOSITION:  Home with 2 liters of home oxygen.   DISCHARGE MEDICATIONS:  Clonidine 0.3 mg 1 tablet p.o. 2 times a day, aspirin 81 mg enteric-coated p.o. once daily, metoprolol tartrate 50 mg p.o. b.i.d., simvastatin 40 mg p.o. once a day, metformin 500 mg 2 tablets p.o. once a day, citalopram 20 mg once daily, oxybutynin 5 mg p.o. 2 times a day, glipizide extended release 10 mg 2 times a day, ProAir 2 puffs inhalation 4 times a day as needed, quinapril 40 mg p.o. b.i.d., loratadine 10 mg p.o. once daily, calcium with  vitamin D 1 tablet p.o. b.i.d. omeprazole 20 mg 1 capsule p.o. 2 times a day, furosemide 20 mg p.o. once a day.   DIET:  Low-fat and low-cholesterol, diabetic diet.  ACTIVITY:  As tolerated. Monitor daily  weights.   FOLLOWUP:  Follow up with outpatient [facility]F Clinic. Follow up with Dr. Nehemiah Massed in a week as recommended and the primary care physician, Dr. Clide Deutscher in 3-5 days.    TOTAL TIME ON DISCHARGE:  45 minutes.   Plan of care was discussed with the patient in detail.   ____________________________ Nicholes Mango, MD ag:ds D: 07/03/2014 15:59:45 ET T: 07/03/2014 16:19:08 ET JOB#: [id number]  cc: Nicholes Mango, MD, <Dictator> Corey Skains, MD Ngwe A. Clide Deutscher, MD  Nicholes Mango MD ELECTRONICALLY SIGNED 07/08/2014 12:06

## 2015-06-17 ENCOUNTER — Encounter: Payer: Self-pay | Admitting: Family

## 2015-06-17 ENCOUNTER — Ambulatory Visit: Payer: Medicare Other | Attending: Family | Admitting: Family

## 2015-06-17 VITALS — BP 76/45 | HR 80 | Resp 20 | Ht 61.0 in | Wt 237.0 lb

## 2015-06-17 DIAGNOSIS — Z853 Personal history of malignant neoplasm of breast: Secondary | ICD-10-CM | POA: Insufficient documentation

## 2015-06-17 DIAGNOSIS — J45909 Unspecified asthma, uncomplicated: Secondary | ICD-10-CM | POA: Diagnosis not present

## 2015-06-17 DIAGNOSIS — K219 Gastro-esophageal reflux disease without esophagitis: Secondary | ICD-10-CM | POA: Insufficient documentation

## 2015-06-17 DIAGNOSIS — F329 Major depressive disorder, single episode, unspecified: Secondary | ICD-10-CM | POA: Insufficient documentation

## 2015-06-17 DIAGNOSIS — I5020 Unspecified systolic (congestive) heart failure: Secondary | ICD-10-CM | POA: Insufficient documentation

## 2015-06-17 DIAGNOSIS — Z86711 Personal history of pulmonary embolism: Secondary | ICD-10-CM | POA: Diagnosis not present

## 2015-06-17 DIAGNOSIS — I5022 Chronic systolic (congestive) heart failure: Secondary | ICD-10-CM

## 2015-06-17 DIAGNOSIS — I959 Hypotension, unspecified: Secondary | ICD-10-CM | POA: Insufficient documentation

## 2015-06-17 DIAGNOSIS — Z7982 Long term (current) use of aspirin: Secondary | ICD-10-CM | POA: Diagnosis not present

## 2015-06-17 DIAGNOSIS — J449 Chronic obstructive pulmonary disease, unspecified: Secondary | ICD-10-CM | POA: Diagnosis not present

## 2015-06-17 DIAGNOSIS — F419 Anxiety disorder, unspecified: Secondary | ICD-10-CM | POA: Diagnosis not present

## 2015-06-17 DIAGNOSIS — Z79899 Other long term (current) drug therapy: Secondary | ICD-10-CM | POA: Insufficient documentation

## 2015-06-17 DIAGNOSIS — E785 Hyperlipidemia, unspecified: Secondary | ICD-10-CM | POA: Insufficient documentation

## 2015-06-17 DIAGNOSIS — E119 Type 2 diabetes mellitus without complications: Secondary | ICD-10-CM | POA: Diagnosis not present

## 2015-06-17 DIAGNOSIS — I951 Orthostatic hypotension: Secondary | ICD-10-CM

## 2015-06-17 NOTE — Progress Notes (Signed)
Subjective:    Patient ID: Melissa Harrell, female    DOB: [birth year], 54 y.o.   MRN: [id number]  Congestive Heart Failure Presents for follow-up visit. The disease course has been stable. Pertinent negatives include no abdominal pain, chest pain, edema, fatigue, orthopnea, palpitations or shortness of breath. The symptoms have been stable. Past treatments include aldosterone receptor blockers, angiotensin receptor blockers, beta blockers and salt and fluid restriction. The treatment provided significant relief. Compliance with prior treatments has been good. Her past medical history is significant for DM. There is no history of HTN. She has one 1st degree relative with heart disease.    Past Medical History  Diagnosis Date  . Diabetes mellitus without complication   . Hypertension   . Hyperlipidemia   . CHF (congestive heart failure)   . Asthma   . COPD (chronic obstructive pulmonary disease)   . Pulmonary emboli   . GERD (gastroesophageal reflux disease)   . Arthritis   . Depression   . Anxiety   . Breast cancer     Past Surgical History  Procedure Laterality Date  . Abdominal hysterectomy    . Mastectomy Right   . Tubal ligation Bilateral     History  Substance Use Topics  . Smoking status: Never Smoker   . Smokeless tobacco: Never Used  . Alcohol Use: No   Allergies  Allergen Reactions  . Bee Venom Swelling  . Sulfa Antibiotics Hives  . Vicodin [Hydrocodone-Acetaminophen] Nausea And Vomiting  . Latex Itching and Rash  . Penicillins Itching    Prior to Admission medications   Medication Sig Start Date End Date Taking? Authorizing Provider  albuterol (PROVENTIL HFA;VENTOLIN HFA) 108 (90 BASE) MCG/ACT inhaler Inhale 2 puffs into the lungs every 6 (six) hours as needed for wheezing or shortness of breath.   Yes Historical Provider, MD  aspirin 81 MG tablet Take 81 mg by mouth daily.   Yes Historical Provider, MD  Calcium Carb-Cholecalciferol 600-200 MG-UNIT TABS Take  1 tablet by mouth 2 (two) times daily.   Yes Historical Provider, MD  carvedilol (COREG) 12.5 MG tablet Take 25 mg by mouth 2 (two) times daily with a meal.   Yes Historical Provider, MD  citalopram (CELEXA) 20 MG tablet Take 20 mg by mouth daily.   Yes Historical Provider, MD  furosemide (LASIX) 20 MG tablet Take 20 mg by mouth daily.   Yes Historical Provider, MD  glipiZIDE (GLUCOTROL XL) 10 MG 24 hr tablet Take 10 mg by mouth 2 (two) times daily.   Yes Historical Provider, MD  loratadine (CLARITIN) 10 MG tablet Take 10 mg by mouth daily.   Yes Historical Provider, MD  metFORMIN (GLUCOPHAGE) 500 MG tablet Take 1,000 mg by mouth 2 (two) times daily with a meal.   Yes Historical Provider, MD  omeprazole (PRILOSEC) 20 MG capsule Take 20 mg by mouth 2 (two) times daily before a meal.   Yes Historical Provider, MD  oxybutynin (DITROPAN) 5 MG tablet Take 5 mg by mouth 2 (two) times daily.   Yes Historical Provider, MD  sacubitril-valsartan (ENTRESTO) 24-26 MG Take 1 tablet by mouth 2 (two) times daily.   Yes Historical Provider, MD  simvastatin (ZOCOR) 40 MG tablet Take 40 mg by mouth daily.   Yes Historical Provider, MD  spironolactone (ALDACTONE) 25 MG tablet Take 25 mg by mouth daily.   Yes Historical Provider, MD      Review of Systems  Constitutional: Negative for appetite change and fatigue.  HENT: Negative for congestion, postnasal drip and sore throat.   Eyes: Negative.   Respiratory: Negative for cough, chest tightness and shortness of breath.   Cardiovascular: Negative for chest pain, palpitations and leg swelling.  Gastrointestinal: Negative for abdominal pain and abdominal distention.  Endocrine: Negative.   Genitourinary: Negative.   Musculoskeletal: Negative.   Skin: Negative.   Allergic/Immunologic: Negative.   Neurological: Negative for dizziness and headaches.  Hematological: Negative for adenopathy. Does not bruise/bleed easily.  Psychiatric/Behavioral: Negative for sleep  disturbance (sleeping on 1 pillow) and dysphoric mood.       Objective:   Physical Exam  Constitutional: She is oriented to person, place, and time. She appears well-developed and well-nourished.  HENT:  Head: Normocephalic and atraumatic.  Eyes: Conjunctivae are normal. Pupils are equal, round, and reactive to light.  Neck: Normal range of motion. Neck supple.  Cardiovascular: Normal rate and regular rhythm.   Pulmonary/Chest: Effort normal and breath sounds normal. She has no wheezes. She has no rales.  Abdominal: Soft. She exhibits no distension.  Musculoskeletal: She exhibits no edema or tenderness.  Neurological: She is alert and oriented to person, place, and time.  Skin: Skin is warm and dry.  Psychiatric: She has a normal mood and affect. Her behavior is normal. Thought content normal.  Nursing note and vitals reviewed.  BP 76/45 mmHg  Pulse 80  Resp 20  Ht 5\' 1"  (1.549 m)  Wt 237 lb (107.502 kg)  BMI 44.80 kg/m2  SpO2 100%  LMP  (LMP Unknown)        Assessment & Plan:  1: Chronic heart failure with reduced ejection fraction- Patient presents currently without any symptoms at this time. She says that she didn't have any difficulty walking into the office today. She had an echocardiogram done on 03/14/15 which showed an improvement of her EF to 45% (previously was 20-25%). Since then, she hasn't been weighing herself. Discussed the importance of resuming weighing herself daily and to call for an overnight weight gain of >2 pounds or a weekly weight gain of >5 pounds. Has lost a couple of pounds since she was here last. She continues to not use salt and uses Mrs. Dash seasonings instead. Tries to be as active as she can and does use her cane to walk with.  2: Hypotension- Blood pressure remains low and she says that it gets low at home sometimes as well. She denies any dizziness, lightheadedness or any falls. Unable to titrate entresto due to low blood pressure. 3: Diabetes-  She says that she didn't have time to check her glucose this morning before her ride came. Yesterday morning it was 120. Continues to take glipizide and metformin.  Return in 3 months or sooner for any questions/problems before the next office visit.

## 2015-06-17 NOTE — Patient Instructions (Addendum)
Resume weighing daily and call for an overnight weight gain of >2 pounds or a weekly weight gain of >5 pounds.    Low-Sodium Eating Plan Sodium raises blood pressure and causes water to be held in the body. Getting less sodium from food will help lower your blood pressure, reduce any swelling, and protect your heart, liver, and kidneys. We get sodium by adding salt (sodium chloride) to food. Most of our sodium comes from canned, boxed, and frozen foods. Restaurant foods, fast foods, and pizza are also very high in sodium. Even if you take medicine to lower your blood pressure or to reduce fluid in your body, getting less sodium from your food is important. WHAT IS MY PLAN? Most people should limit their sodium intake to 2,300 mg a day. Your health care provider recommends that you limit your sodium intake to _2000mg _ a day.  WHAT DO I NEED TO KNOW ABOUT THIS EATING PLAN? For the low-sodium eating plan, you will follow these general guidelines:  Choose foods with a % Daily Value for sodium of less than 5% (as listed on the food label).   Use salt-free seasonings or herbs instead of table salt or sea salt.   Check with your health care provider or pharmacist before using salt substitutes.   Eat fresh foods.  Eat more vegetables and fruits.  Limit canned vegetables. If you do use them, rinse them well to decrease the sodium.   Limit cheese to 1 oz (28 g) per day.   Eat lower-sodium products, often labeled as "lower sodium" or "no salt added."  Avoid foods that contain monosodium glutamate (MSG). MSG is sometimes added to [place] food and some canned foods.  Check food labels (Nutrition Facts labels) on foods to learn how much sodium is in one serving.  Eat more home-cooked food and less restaurant, buffet, and fast food.  When eating at a restaurant, ask that your food be prepared with less salt or none, if possible.  HOW DO I READ FOOD LABELS FOR SODIUM INFORMATION? The  Nutrition Facts label lists the amount of sodium in one serving of the food. If you eat more than one serving, you must multiply the listed amount of sodium by the number of servings. Food labels may also identify foods as:  Sodium free--Less than 5 mg in a serving.  Very low sodium--35 mg or less in a serving.  Low sodium--140 mg or less in a serving.  Light in sodium--50% less sodium in a serving. For example, if a food that usually has 300 mg of sodium is changed to become light in sodium, it will have 150 mg of sodium.  Reduced sodium--25% less sodium in a serving. For example, if a food that usually has 400 mg of sodium is changed to reduced sodium, it will have 300 mg of sodium. WHAT FOODS CAN I EAT? Grains Low-sodium cereals, including oats, puffed wheat and rice, and shredded wheat cereals. Low-sodium crackers. Unsalted rice and pasta. Lower-sodium bread.  Vegetables Frozen or fresh vegetables. Low-sodium or reduced-sodium canned vegetables. Low-sodium or reduced-sodium tomato sauce and paste. Low-sodium or reduced-sodium tomato and vegetable juices.  Fruits Fresh, frozen, and canned fruit. Fruit juice.  Meat and Other Protein Products Low-sodium canned tuna and salmon. Fresh or frozen meat, poultry, seafood, and fish. Lamb. Unsalted nuts. Dried beans, peas, and lentils without added salt. Unsalted canned beans. Homemade soups without salt. Eggs.  Dairy Milk. Soy milk. Ricotta cheese. Low-sodium or reduced-sodium cheeses. Yogurt.  Condiments  and dried herbs and spices. Salt-free seasonings. Onion and garlic powders. Low-sodium varieties of mustard and ketchup. Lemon juice.  Fats and Oils Reduced-sodium salad dressings. Unsalted butter.  Other Unsalted popcorn and pretzels.  The items listed above may not be a complete list of recommended foods or beverages. Contact your dietitian for more options. WHAT FOODS ARE NOT RECOMMENDED? Grains Instant hot cereals.  Bread stuffing, pancake, and biscuit mixes. Croutons. Seasoned rice or pasta mixes. Noodle soup cups. Boxed or frozen macaroni and cheese. Self-rising flour. Regular salted crackers. Vegetables Regular canned vegetables. Regular canned tomato sauce and paste. Regular tomato and vegetable juices. Frozen vegetables in sauces. Salted french fries. Olives. Pickles. Relishes. Sauerkraut. Salsa. Meat and Other Protein Products Salted, canned, smoked, spiced, or pickled meats, seafood, or fish. Bacon, ham, sausage, hot dogs, corned beef, chipped beef, and packaged luncheon meats. Salt pork. Jerky. Pickled herring. Anchovies, regular canned tuna, and sardines. Salted nuts. Dairy Processed cheese and cheese spreads. Cheese curds. Blue cheese and cottage cheese. Buttermilk.  Condiments Onion and garlic salt, seasoned salt, table salt, and sea salt. Canned and packaged gravies. Worcestershire sauce. Tartar sauce. Barbecue sauce. Teriyaki sauce. Soy sauce, including reduced sodium. Steak sauce. Fish sauce. Oyster sauce. Cocktail sauce. Horseradish. Regular ketchup and mustard. Meat flavorings and tenderizers. Bouillon cubes. Hot sauce. Tabasco sauce. Marinades. Taco seasonings. Relishes. Fats and Oils Regular salad dressings. Salted butter. Margarine. Ghee. Bacon fat.  Other Potato and tortilla chips. Corn chips and puffs. Salted popcorn and pretzels. Canned or dried soups. Pizza. Frozen entrees and pot pies.  The items listed above may not be a complete list of foods and beverages to avoid. Contact your dietitian for more information. Document Released: 05/11/2002 Document Revised: 11/24/2013 Document Reviewed: 09/23/2013 ExitCare Patient Information 2015 [employer]. This information is not intended to replace advice given to you by your health care provider. Make sure you discuss any questions you have with your health care provider.  

## 2015-06-20 ENCOUNTER — Other Ambulatory Visit: Payer: Self-pay | Admitting: Family

## 2015-07-25 ENCOUNTER — Ambulatory Visit
Admission: RE | Admit: 2015-07-25 | Discharge: 2015-07-25 | Disposition: A | Discharge status: Home / Self Care | Payer: Medicare Other | Source: Ambulatory Visit | Attending: Oncology | Admitting: Oncology

## 2015-07-25 DIAGNOSIS — Z1231 Encounter for screening mammogram for malignant neoplasm of breast: Secondary | ICD-10-CM | POA: Diagnosis not present

## 2015-07-25 DIAGNOSIS — Z9011 Acquired absence of right breast and nipple: Secondary | ICD-10-CM | POA: Insufficient documentation

## 2015-07-25 DIAGNOSIS — Z129 Encounter for screening for malignant neoplasm, site unspecified: Secondary | ICD-10-CM

## 2015-07-29 ENCOUNTER — Other Ambulatory Visit: Payer: Self-pay | Admitting: *Deleted

## 2015-07-29 DIAGNOSIS — C50919 Malignant neoplasm of unspecified site of unspecified female breast: Secondary | ICD-10-CM

## 2015-08-02 ENCOUNTER — Encounter: Payer: Self-pay | Admitting: Oncology

## 2015-08-02 ENCOUNTER — Inpatient Hospital Stay: Payer: Medicare Other

## 2015-08-02 ENCOUNTER — Inpatient Hospital Stay: Payer: Medicare Other | Attending: Oncology | Admitting: Oncology

## 2015-08-02 VITALS — BP 99/65 | HR 78 | Temp 96.6°F | Wt 242.4 lb

## 2015-08-02 DIAGNOSIS — Z86711 Personal history of pulmonary embolism: Secondary | ICD-10-CM | POA: Diagnosis not present

## 2015-08-02 DIAGNOSIS — Z9011 Acquired absence of right breast and nipple: Secondary | ICD-10-CM | POA: Diagnosis not present

## 2015-08-02 DIAGNOSIS — Z923 Personal history of irradiation: Secondary | ICD-10-CM | POA: Insufficient documentation

## 2015-08-02 DIAGNOSIS — I10 Essential (primary) hypertension: Secondary | ICD-10-CM | POA: Insufficient documentation

## 2015-08-02 DIAGNOSIS — Z853 Personal history of malignant neoplasm of breast: Secondary | ICD-10-CM | POA: Diagnosis not present

## 2015-08-02 DIAGNOSIS — E119 Type 2 diabetes mellitus without complications: Secondary | ICD-10-CM | POA: Diagnosis not present

## 2015-08-02 DIAGNOSIS — E785 Hyperlipidemia, unspecified: Secondary | ICD-10-CM | POA: Insufficient documentation

## 2015-08-02 DIAGNOSIS — Z79899 Other long term (current) drug therapy: Secondary | ICD-10-CM | POA: Diagnosis not present

## 2015-08-02 DIAGNOSIS — C50911 Malignant neoplasm of unspecified site of right female breast: Secondary | ICD-10-CM | POA: Diagnosis present

## 2015-08-02 DIAGNOSIS — J449 Chronic obstructive pulmonary disease, unspecified: Secondary | ICD-10-CM | POA: Diagnosis not present

## 2015-08-02 DIAGNOSIS — Z9223 Personal history of estrogen therapy: Secondary | ICD-10-CM | POA: Insufficient documentation

## 2015-08-02 DIAGNOSIS — Z170 Estrogen receptor positive status [ER+]: Secondary | ICD-10-CM | POA: Diagnosis not present

## 2015-08-02 DIAGNOSIS — M1990 Unspecified osteoarthritis, unspecified site: Secondary | ICD-10-CM | POA: Diagnosis not present

## 2015-08-02 DIAGNOSIS — C50919 Malignant neoplasm of unspecified site of unspecified female breast: Secondary | ICD-10-CM | POA: Insufficient documentation

## 2015-08-02 DIAGNOSIS — Z803 Family history of malignant neoplasm of breast: Secondary | ICD-10-CM | POA: Diagnosis not present

## 2015-08-02 DIAGNOSIS — Z8049 Family history of malignant neoplasm of other genital organs: Secondary | ICD-10-CM | POA: Diagnosis not present

## 2015-08-02 LAB — CBC WITH DIFFERENTIAL/PLATELET
BASOS ABS: 0.1 10*3/uL (ref 0–0.1)
BASOS PCT: 1 %
Eosinophils Absolute: 0.2 10*3/uL (ref 0–0.7)
Eosinophils Relative: 3 %
HEMATOCRIT: 38.2 % (ref 35.0–47.0)
Hemoglobin: 12.4 g/dL (ref 12.0–16.0)
Lymphocytes Relative: 38 %
Lymphs Abs: 2.6 10*3/uL (ref 1.0–3.6)
MCH: 25.3 pg — ABNORMAL LOW (ref 26.0–34.0)
MCHC: 32.4 g/dL (ref 32.0–36.0)
MCV: 78.2 fL — AB (ref 80.0–100.0)
Monocytes Absolute: 0.6 10*3/uL (ref 0.2–0.9)
Monocytes Relative: 8 %
NEUTROS ABS: 3.5 10*3/uL (ref 1.4–6.5)
Neutrophils Relative %: 50 %
Platelets: 229 10*3/uL (ref 150–440)
RBC: 4.89 MIL/uL (ref 3.80–5.20)
RDW: 16.4 % — AB (ref 11.5–14.5)
WBC: 6.9 10*3/uL (ref 3.6–11.0)

## 2015-08-02 LAB — COMPREHENSIVE METABOLIC PANEL
ALK PHOS: 44 U/L (ref 38–126)
ALT: 12 U/L — AB (ref 14–54)
AST: 17 U/L (ref 15–41)
Albumin: 3.9 g/dL (ref 3.5–5.0)
Anion gap: 4 — ABNORMAL LOW (ref 5–15)
BUN: 22 mg/dL — AB (ref 6–20)
CHLORIDE: 104 mmol/L (ref 101–111)
CO2: 28 mmol/L (ref 22–32)
CREATININE: 1.03 mg/dL — AB (ref 0.44–1.00)
Calcium: 8.4 mg/dL — ABNORMAL LOW (ref 8.9–10.3)
GFR calc Af Amer: >60 mL/min (ref >60)
GFR calc non Af Amer: >60 mL/min (ref >60)
Glucose, Bld: 163 mg/dL — ABNORMAL HIGH (ref 65–99)
Potassium: 4.2 mmol/L (ref 3.5–5.1)
SODIUM: 136 mmol/L (ref 135–145)
Total Bilirubin: 0.4 mg/dL (ref 0.3–1.2)
Total Protein: 7.1 g/dL (ref 6.5–8.1)

## 2015-08-02 NOTE — Progress Notes (Signed)
Patient does not have living will.  Never smoked. 

## 2015-08-02 NOTE — Progress Notes (Signed)
[place] @ [facility] [phone]  Fax:([phone]) [place]: 08/05/1961  MR#: [id number]  OFH#:[id number]  Patient Care Team: Donnie Coffin, MD as PCP - General (Family Medicine) Alisa Graff, FNP as Nurse Practitioner (Cardiology) Corey Skains, MD as Consulting Physician (Cardiology)  CHIEF COMPLAINT:  Chief Complaint  Patient presents with  . Follow-up   Chief Complaint/Diagnosis:   1. Carcinoma of breast, T1, N0, M0 tumor May of 2003, status post lumpectomy, radiation 2. Abnormal mammogram in March of 2006 right breast, status post right modified mastectomy 1.2 cm, invasive cancer, lymph node negative. Estrogen receptor positive, progesterone receptor positive, HER-2 negative patient  Is off all anti-hormonal therapy HPI:     No history exists.    No flowsheet data found.  INTERVAL HISTORY:  54 year old African-American lady who had a previous history of carcinoma breast stage I Patient was around 54 years old when diagnosis of breast cancer was done patient had a cousin who died of breast cancer.  Mother had carcinoma of cervix. A mammogram done recently and was reported to be negative.  Did not have any significant problem since last evaluation patient does have primary care physician   REVIEW OF SYSTEMS:   GENERAL:  Feels good.  Active.  No fevers, sweats or weight loss. PERFORMANCE STATUS (ECOG): 0 HEENT:  No visual changes, runny nose, sore throat, mouth sores or tenderness. Lungs: No shortness of breath or cough.  No hemoptysis. Cardiac:  No chest pain, palpitations, orthopnea, or PND. GI:  No nausea, vomiting, diarrhea, constipation, melena or hematochezia. GU:  No urgency, frequency, dysuria, or hematuria. Musculoskeletal:  No back pain.  No joint pain.  No muscle tenderness. Extremities:  No pain or swelling. Skin:  No rashes or skin changes. Neuro:  No headache, numbness or weakness, balance or coordination issues. Endocrine:   No diabetes, thyroid issues, hot flashes or night sweats. Psych:  No mood changes, depression or anxiety. Pain:  No focal pain. Review of systems:  All other systems reviewed and found to be negative. As per HPI. Otherwise, a complete review of systems is negatve.  PAST MEDICAL HISTORY: Past Medical History  Diagnosis Date  . Diabetes mellitus without complication   . Hypertension   . Hyperlipidemia   . CHF (congestive heart failure)   . Asthma   . COPD (chronic obstructive pulmonary disease)   . Pulmonary emboli   . GERD (gastroesophageal reflux disease)   . Arthritis   . Depression   . Anxiety   . Breast cancer 2003,2006    right     PAST SURGICAL HISTORY: Past Surgical History  Procedure Laterality Date  . Abdominal hysterectomy    . Tubal ligation Bilateral   . Mastectomy Right 2006  . Breast excisional biopsy Right 2003    positive, chemo and radiation  . Breast biopsy Right 2006    positive    FAMILY HISTORY Family History  Problem Relation Age of Onset  . Cancer - Cervical Maternal Grandmother   . Heart disease Mother   . Heart failure Sister   . Breast cancer Cousin     ADVANCED DIRECTIVES:  No flowsheet data found.  HEALTH MAINTENANCE: Social History  Substance Use Topics  . Smoking status: Never Smoker   . Smokeless tobacco: Never Used  . Alcohol Use: No      Allergies  Allergen Reactions  . Bee Venom Swelling  . Sulfa Antibiotics Hives  . Vicodin [Hydrocodone-Acetaminophen]  Nausea And Vomiting  . Latex Itching and Rash  . Penicillins Itching    Current Outpatient Prescriptions  Medication Sig Dispense Refill  . albuterol (PROVENTIL HFA;VENTOLIN HFA) 108 (90 BASE) MCG/ACT inhaler Inhale 2 puffs into the lungs every 6 (six) hours as needed for wheezing or shortness of breath.    Marland Kitchen aspirin 81 MG tablet Take 81 mg by mouth daily.    . Calcium Carb-Cholecalciferol 600-200 MG-UNIT TABS Take 1 tablet by mouth 2 (two) times daily.    .  carvedilol (COREG) 12.5 MG tablet Take 25 mg by mouth 2 (two) times daily with a meal.    . citalopram (CELEXA) 20 MG tablet Take 20 mg by mouth daily.    Marland Kitchen ENTRESTO 24-26 MG TAKE ONE TABLET TWICE DAILY ( STOP QUINAPRIL) 60 tablet 5  . furosemide (LASIX) 20 MG tablet Take 20 mg by mouth daily.    Marland Kitchen glipiZIDE (GLUCOTROL XL) 10 MG 24 hr tablet Take 10 mg by mouth 2 (two) times daily.    Marland Kitchen loratadine (CLARITIN) 10 MG tablet Take 10 mg by mouth daily.    . metFORMIN (GLUCOPHAGE) 500 MG tablet Take 1,000 mg by mouth 2 (two) times daily with a meal.    . omeprazole (PRILOSEC) 20 MG capsule Take 20 mg by mouth 2 (two) times daily before a meal.    . oxybutynin (DITROPAN) 5 MG tablet Take 5 mg by mouth 2 (two) times daily.    . simvastatin (ZOCOR) 40 MG tablet Take 40 mg by mouth daily.    Marland Kitchen spironolactone (ALDACTONE) 25 MG tablet Take 25 mg by mouth daily.     No current facility-administered medications for this visit.     right mastectomy: 27-Apr-2005   Tubal Ligation:   Preventive Screening:  Has patient had any of the following test? Colonscopy  Mammography (1)   Last Colonoscopy: 2013 if[place]ar(1)   Last Mammography: july 2013(1)   PFSH: Comments: grandmother had breast cancer.  Family history of rectal cancer in mother.  Family history of diabetes and heart disease in the family  Social History: does not drink.  Does not smoke  Additional Past Medical and Surgical History: Diabetes  Hypertension  Emphysema  Gastroesophageal reflux disease   OBJECTIVE:  Filed Vitals:   08/02/15 1110  BP: 99/65  Pulse: 78  Temp: 96.6 F (35.9 C)     Body mass index is 45.82 kg/(m^2).    ECOG FS01  PHYSICAL EXAM: General  status: Performance status is good.  Patient has not lost significant weight HEENT: No evidence of stomatitis. Sclera and conjunctivae :: No jaundice.   pale looking. Lungs: Air  entry equal on both sides.  No rhonchi.  No rales.  Cardiac: Heart sounds are normal.  No  pericardial rub.  No murmur. Lymphatic system: Cervical, axillary, inguinal, lymph nodes not palpable GI: Abdomen is soft.  No ascites.  Liver spleen not palpable.  No tenderness.  Bowel sounds are within normal limit Lower extremity: No edema Neurological system: Higher functions, cranial nerves intact no evidence of peripheral neuropathy. Skin: No rash.  No ecchymosis.. Moderately obese lady Examination of right chest wall area no evidence of recurrent disease.  Patient had mastectomy on the right side.  Left breast free of masses.   LAB RESULTS:  CBC Latest Ref Rng 08/02/2015 07/27/2014  WBC 3.6 - 11.0 K/uL 6.9 6.2  Hemoglobin 12.0 - 16.0 g/dL 12.4 12.0  Hematocrit 35.0 - 47.0 % 38.2 37.6  Platelets 150 -  440 K/uL 229 232    Appointment on 08/02/2015  Component Date Value Ref Range Status  . WBC 08/02/2015 6.9  3.6 - 11.0 K/uL Final  . RBC 08/02/2015 4.89  3.80 - 5.20 MIL/uL Final  . Hemoglobin 08/02/2015 12.4  12.0 - 16.0 g/dL Final  . HCT 08/02/2015 38.2  35.0 - 47.0 % Final  . MCV 08/02/2015 78.2* 80.0 - 100.0 fL Final  . MCH 08/02/2015 25.3* 26.0 - 34.0 pg Final  . MCHC 08/02/2015 32.4  32.0 - 36.0 g/dL Final  . RDW 08/02/2015 16.4* 11.5 - 14.5 % Final  . Platelets 08/02/2015 229  150 - 440 K/uL Final  . Neutrophils Relative % 08/02/2015 50   Final  . Neutro Abs 08/02/2015 3.5  1.4 - 6.5 K/uL Final  . Lymphocytes Relative 08/02/2015 38   Final  . Lymphs Abs 08/02/2015 2.6  1.0 - 3.6 K/uL Final  . Monocytes Relative 08/02/2015 8   Final  . Monocytes Absolute 08/02/2015 0.6  0.2 - 0.9 K/uL Final  . Eosinophils Relative 08/02/2015 3   Final  . Eosinophils Absolute 08/02/2015 0.2  0 - 0.7 K/uL Final  . Basophils Relative 08/02/2015 1   Final  . Basophils Absolute 08/02/2015 0.1  0 - 0.1 K/uL Final  . Sodium 08/02/2015 136  135 - 145 mmol/L Final  . Potassium 08/02/2015 4.2  3.5 - 5.1 mmol/L Final  . Chloride 08/02/2015 104  101 - 111 mmol/L Final  . CO2 08/02/2015 28  22 -  32 mmol/L Final  . Glucose, Bld 08/02/2015 163* 65 - 99 mg/dL Final  . BUN 08/02/2015 22* 6 - 20 mg/dL Final  . Creatinine, Ser 08/02/2015 1.03* 0.44 - 1.00 mg/dL Final  . Calcium 08/02/2015 8.4* 8.9 - 10.3 mg/dL Final  . Total Protein 08/02/2015 7.1  6.5 - 8.1 g/dL Final  . Albumin 08/02/2015 3.9  3.5 - 5.0 g/dL Final  . AST 08/02/2015 17  15 - 41 U/L Final  . ALT 08/02/2015 12* 14 - 54 U/L Final  . Alkaline Phosphatase 08/02/2015 44  38 - 126 U/L Final  . Total Bilirubin 08/02/2015 0.4  0.3 - 1.2 mg/dL Final  . GFR calc non Af Amer 08/02/2015 >60  >60 mL/min Final  . GFR calc Af Amer 08/02/2015 >60  >60 mL/min Final   Comment: (NOTE) The eGFR has been calculated using the CKD EPI equation. This calculation has not been validated in all clinical situations. eGFR's persistently <60 mL/min signify possible Chronic Kidney Disease.   . Anion gap 08/02/2015 4* 5 - 15 Final      STUDIES: Mm Digital Screening Unilat L  07/25/2015   CLINICAL DATA:  Screening.  EXAM: DIGITAL SCREENING UNILATERAL LEFT MAMMOGRAM WITH CAD  COMPARISON:  Previous exam(s).  ACR Breast Density Category b: There are scattered areas of fibroglandular density.  FINDINGS: There are no findings suspicious for malignancy. There has been a previous right mastectomy. Images were processed with CAD.  IMPRESSION: No mammographic evidence of malignancy. A result letter of this screening mammogram will be mailed directly to the patient.  RECOMMENDATION: Screening mammogram in one year. (Code:SM-B-01Y)  BI-RADS CATEGORY  1: Negative.   Electronically Signed   By: Altamese Cabal M.D.   On: 07/25/2015 10:22    ASSESSMENT: Carcinoma breast stage I status post mastectomy  On clinical ground there is no evidence of recurrent disease patient is post 10 years since initial evaluation and 5 years after anti-hormonal therapy  Last mammogram  on July 25 2015 was reported to be negative  Considering that patient has early onset of  breast cancer and would like to consider  My risk genetic mutation study.  . Patient would have 1 month follow-up after results. If that study is negative for any mutation then patient would be regularly followed by primary care physician and will be discharged from our care.  MEDICAL DECISION MAKING:  Lab data and x-ray data has been reviewed.   There is no clinical evidence of recurrent disease  Patient expressed understanding and was in agreement with this plan. She also understands that She can call clinic at any time with any questions, concerns, or complaints.    No matching staging information was found for the patient.  Forest Gleason, MD   08/02/2015 11:32 AM

## 2015-08-03 LAB — CANCER ANTIGEN 27.29: CA 27.29: 16 U/mL (ref 0.0–38.6)

## 2015-09-13 ENCOUNTER — Ambulatory Visit: Payer: Medicare Other | Admitting: Oncology

## 2015-09-19 ENCOUNTER — Inpatient Hospital Stay: Payer: Medicare Other | Admitting: Oncology

## 2015-09-19 ENCOUNTER — Ambulatory Visit: Payer: Medicare Other | Admitting: Family

## 2015-09-21 ENCOUNTER — Ambulatory Visit: Payer: Medicare Other | Attending: Family | Admitting: Family

## 2015-09-21 ENCOUNTER — Encounter: Payer: Self-pay | Admitting: Family

## 2015-09-21 VITALS — BP 94/58 | HR 71 | Resp 20 | Ht 61.0 in | Wt 246.0 lb

## 2015-09-21 DIAGNOSIS — I952 Hypotension due to drugs: Secondary | ICD-10-CM

## 2015-09-21 DIAGNOSIS — Z7984 Long term (current) use of oral hypoglycemic drugs: Secondary | ICD-10-CM | POA: Insufficient documentation

## 2015-09-21 DIAGNOSIS — I959 Hypotension, unspecified: Secondary | ICD-10-CM | POA: Insufficient documentation

## 2015-09-21 DIAGNOSIS — I5022 Chronic systolic (congestive) heart failure: Secondary | ICD-10-CM | POA: Diagnosis present

## 2015-09-21 DIAGNOSIS — Z7982 Long term (current) use of aspirin: Secondary | ICD-10-CM | POA: Insufficient documentation

## 2015-09-21 DIAGNOSIS — J449 Chronic obstructive pulmonary disease, unspecified: Secondary | ICD-10-CM | POA: Insufficient documentation

## 2015-09-21 DIAGNOSIS — E119 Type 2 diabetes mellitus without complications: Secondary | ICD-10-CM | POA: Diagnosis not present

## 2015-09-21 DIAGNOSIS — Z79899 Other long term (current) drug therapy: Secondary | ICD-10-CM | POA: Insufficient documentation

## 2015-09-21 DIAGNOSIS — K219 Gastro-esophageal reflux disease without esophagitis: Secondary | ICD-10-CM | POA: Insufficient documentation

## 2015-09-21 DIAGNOSIS — J45909 Unspecified asthma, uncomplicated: Secondary | ICD-10-CM | POA: Diagnosis not present

## 2015-09-21 DIAGNOSIS — Z86711 Personal history of pulmonary embolism: Secondary | ICD-10-CM | POA: Insufficient documentation

## 2015-09-21 DIAGNOSIS — E785 Hyperlipidemia, unspecified: Secondary | ICD-10-CM | POA: Insufficient documentation

## 2015-09-21 DIAGNOSIS — I10 Essential (primary) hypertension: Secondary | ICD-10-CM | POA: Diagnosis not present

## 2015-09-21 NOTE — Patient Instructions (Signed)
Continue weighing daily and call for an overnight weight gain of > 2 pounds or a weekly weight gain of >5 pounds. 

## 2015-09-21 NOTE — Progress Notes (Signed)
Subjective:    Patient ID: Melissa Harrell, female    DOB: 05/06/1961, 54 y.o.   MRN: [id number]  Congestive Heart Failure Presents for follow-up visit. The disease course has been stable. Associated symptoms include fatigue (varies). Pertinent negatives include no abdominal pain, chest pain, chest pressure, edema, orthopnea, palpitations or shortness of breath. The symptoms have been stable. Past treatments include beta blockers, salt and fluid restriction, angiotensin receptor blockers and aldosterone receptor blockers. The treatment provided significant relief. Compliance with prior treatments has been good. Her past medical history is significant for chronic lung disease, DM and HTN. She has one 1st degree relative with heart disease. Compliance with total regimen is 76-100%.  Other This is a chronic (fatigue) problem. The current episode started more than 1 year ago. The problem occurs intermittently. The problem has been unchanged. Associated symptoms include fatigue (varies). Pertinent negatives include no abdominal pain, chest pain, congestion, coughing, headaches, myalgias, nausea, sore throat or visual change. The symptoms are aggravated by walking. She has tried rest for the symptoms. The treatment provided moderate relief.    Past Medical History  Diagnosis Date  . Diabetes mellitus without complication   . Hypertension   . Hyperlipidemia   . CHF (congestive heart failure)   . Asthma   . COPD (chronic obstructive pulmonary disease)   . Pulmonary emboli   . GERD (gastroesophageal reflux disease)   . Arthritis   . Depression   . Anxiety   . Breast cancer 2003,2006    right     Past Surgical History  Procedure Laterality Date  . Abdominal hysterectomy    . Tubal ligation Bilateral   . Mastectomy Right 2006  . Breast excisional biopsy Right 2003    positive, chemo and radiation  . Breast biopsy Right 2006    positive    Family History  Problem Relation Age of Onset  .  Cancer - Cervical Maternal Grandmother   . Heart disease Mother   . Heart failure Sister   . Breast cancer Cousin     Social History  Substance Use Topics  . Smoking status: Never Smoker   . Smokeless tobacco: Never Used  . Alcohol Use: No    Allergies  Allergen Reactions  . Bee Venom Swelling  . Sulfa Antibiotics Hives  . Vicodin [Hydrocodone-Acetaminophen] Nausea And Vomiting  . Latex Itching and Rash  . Penicillins Itching    Prior to Admission medications   Medication Sig Start Date End Date Taking? Authorizing Provider  albuterol (PROVENTIL HFA;VENTOLIN HFA) 108 (90 BASE) MCG/ACT inhaler Inhale 2 puffs into the lungs every 6 (six) hours as needed for wheezing or shortness of breath.   Yes Historical Provider, MD  aspirin 81 MG tablet Take 81 mg by mouth daily.   Yes Historical Provider, MD  Calcium Carb-Cholecalciferol 600-200 MG-UNIT TABS Take 1 tablet by mouth 2 (two) times daily.   Yes Historical Provider, MD  carvedilol (COREG) 12.5 MG tablet Take 25 mg by mouth 2 (two) times daily with a meal.   Yes Historical Provider, MD  citalopram (CELEXA) 20 MG tablet Take 20 mg by mouth daily.   Yes Historical Provider, MD  ENTRESTO 24-26 MG TAKE ONE TABLET TWICE DAILY ( STOP QUINAPRIL) 06/20/15  Yes Alisa Graff, FNP  furosemide (LASIX) 20 MG tablet Take 20 mg by mouth daily.   Yes Historical Provider, MD  glipiZIDE (GLUCOTROL XL) 10 MG 24 hr tablet Take 10 mg by mouth 2 (two) times daily.  Yes Historical Provider, MD  loratadine (CLARITIN) 10 MG tablet Take 10 mg by mouth daily.   Yes Historical Provider, MD  metFORMIN (GLUCOPHAGE) 500 MG tablet Take 1,000 mg by mouth 2 (two) times daily with a meal.   Yes Historical Provider, MD  omeprazole (PRILOSEC) 20 MG capsule Take 20 mg by mouth 2 (two) times daily before a meal.   Yes Historical Provider, MD  oxybutynin (DITROPAN) 5 MG tablet Take 5 mg by mouth 2 (two) times daily.   Yes Historical Provider, MD  simvastatin (ZOCOR) 40  MG tablet Take 40 mg by mouth daily.   Yes Historical Provider, MD  spironolactone (ALDACTONE) 25 MG tablet Take 25 mg by mouth daily.   Yes Historical Provider, MD     Review of Systems  Constitutional: Positive for fatigue (varies). Negative for appetite change.  HENT: Negative for congestion, postnasal drip and sore throat.   Eyes: Negative.   Respiratory: Negative for cough, chest tightness and shortness of breath.   Cardiovascular: Negative for chest pain, palpitations and leg swelling.  Gastrointestinal: Negative for nausea, abdominal pain and abdominal distention.  Endocrine: Negative.   Genitourinary: Negative.   Musculoskeletal: Positive for back pain and neck stiffness. Negative for myalgias.  Skin: Negative.   Allergic/Immunologic: Negative.   Neurological: Negative for dizziness, light-headedness and headaches.  Hematological: Negative for adenopathy. Does not bruise/bleed easily.  Psychiatric/Behavioral: Negative for sleep disturbance (sleeping on 1 pillow) and dysphoric mood. The patient is not nervous/anxious.        Objective:   Physical Exam  Constitutional: She is oriented to person, place, and time. She appears well-developed and well-nourished.  HENT:  Head: Normocephalic and atraumatic.  Eyes: Conjunctivae are normal. Pupils are equal, round, and reactive to light.  Neck: Normal range of motion. Neck supple.  Cardiovascular: Normal rate and regular rhythm.   Pulmonary/Chest: Effort normal. She has no wheezes. She has no rales.  Abdominal: Soft. She exhibits no distension. There is no tenderness.  Musculoskeletal: She exhibits no edema or tenderness.  Neurological: She is alert and oriented to person, place, and time.  Skin: Skin is warm and dry.  Psychiatric: She has a normal mood and affect. Her behavior is normal. Thought content normal.  Nursing note and vitals reviewed.   BP 94/58 mmHg  Pulse 71  Resp 20  Ht 5\' 1"  (1.549 m)  Wt 246 lb (111.585 kg)   BMI 46.51 kg/m2  SpO2 100%  LMP  (LMP Unknown)       Assessment & Plan:  1: Chronic heart failure with reduced ejection fraction- Patient presents with fatigue upon exertion but she says that it occurs on an intermittent basis. Yesterday she said that she felt tired and really didn't do much but today she feels really good. Encouraged her to rest when she feels tired. She has been walking some and tries to walk on a daily basis. Continues to weigh herself and reports a stable weight. By our scale, she has gained 9 pounds since July 2016. Reminded to call for an overnight weight gain of >2 pounds or a weekly weight gain of >5 pounds. She is not adding any salt to her food and continues to use Mrs. Dash for seasoning. She did receive her flu vaccine already. She sees her cardiologist on 09/23/15.  2: Hypotension- Blood pressure remains on the low side although patient doesn't have any dizziness with position changes. Unable to titrate entresto though due to her continued low blood pressure.  3: Diabetes- She says that she last checked it a couple of days ago and it was 69. She continues to take glucotrol and glucophage.   Return in 3 months or sooner for any questions/problems before then.

## 2015-10-04 ENCOUNTER — Inpatient Hospital Stay: Payer: Medicare Other | Admitting: Oncology

## 2015-10-21 ENCOUNTER — Encounter: Payer: Self-pay | Admitting: Oncology

## 2015-12-22 ENCOUNTER — Ambulatory Visit: Payer: Medicare Other | Admitting: Family

## 2015-12-29 ENCOUNTER — Ambulatory Visit: Payer: Medicare Other | Attending: Family | Admitting: Family

## 2015-12-29 ENCOUNTER — Encounter: Payer: Self-pay | Admitting: Family

## 2015-12-29 VITALS — BP 114/45 | HR 94 | Resp 20 | Ht 61.0 in | Wt 247.0 lb

## 2015-12-29 DIAGNOSIS — J45909 Unspecified asthma, uncomplicated: Secondary | ICD-10-CM | POA: Diagnosis not present

## 2015-12-29 DIAGNOSIS — Z7982 Long term (current) use of aspirin: Secondary | ICD-10-CM | POA: Diagnosis not present

## 2015-12-29 DIAGNOSIS — Z91030 Bee allergy status: Secondary | ICD-10-CM | POA: Diagnosis not present

## 2015-12-29 DIAGNOSIS — M1990 Unspecified osteoarthritis, unspecified site: Secondary | ICD-10-CM | POA: Insufficient documentation

## 2015-12-29 DIAGNOSIS — Z882 Allergy status to sulfonamides status: Secondary | ICD-10-CM | POA: Insufficient documentation

## 2015-12-29 DIAGNOSIS — Z7984 Long term (current) use of oral hypoglycemic drugs: Secondary | ICD-10-CM | POA: Diagnosis not present

## 2015-12-29 DIAGNOSIS — J449 Chronic obstructive pulmonary disease, unspecified: Secondary | ICD-10-CM | POA: Diagnosis not present

## 2015-12-29 DIAGNOSIS — E785 Hyperlipidemia, unspecified: Secondary | ICD-10-CM | POA: Insufficient documentation

## 2015-12-29 DIAGNOSIS — F329 Major depressive disorder, single episode, unspecified: Secondary | ICD-10-CM | POA: Insufficient documentation

## 2015-12-29 DIAGNOSIS — K219 Gastro-esophageal reflux disease without esophagitis: Secondary | ICD-10-CM | POA: Insufficient documentation

## 2015-12-29 DIAGNOSIS — I10 Essential (primary) hypertension: Secondary | ICD-10-CM | POA: Insufficient documentation

## 2015-12-29 DIAGNOSIS — Z86711 Personal history of pulmonary embolism: Secondary | ICD-10-CM | POA: Diagnosis not present

## 2015-12-29 DIAGNOSIS — I5022 Chronic systolic (congestive) heart failure: Secondary | ICD-10-CM | POA: Diagnosis present

## 2015-12-29 DIAGNOSIS — E119 Type 2 diabetes mellitus without complications: Secondary | ICD-10-CM | POA: Insufficient documentation

## 2015-12-29 DIAGNOSIS — Z91040 Latex allergy status: Secondary | ICD-10-CM | POA: Insufficient documentation

## 2015-12-29 DIAGNOSIS — Z880 Allergy status to penicillin: Secondary | ICD-10-CM | POA: Diagnosis not present

## 2015-12-29 DIAGNOSIS — I952 Hypotension due to drugs: Secondary | ICD-10-CM

## 2015-12-29 DIAGNOSIS — M25562 Pain in left knee: Secondary | ICD-10-CM | POA: Insufficient documentation

## 2015-12-29 DIAGNOSIS — I959 Hypotension, unspecified: Secondary | ICD-10-CM | POA: Insufficient documentation

## 2015-12-29 DIAGNOSIS — Z885 Allergy status to narcotic agent status: Secondary | ICD-10-CM | POA: Diagnosis not present

## 2015-12-29 DIAGNOSIS — Z79899 Other long term (current) drug therapy: Secondary | ICD-10-CM | POA: Insufficient documentation

## 2015-12-29 MED ORDER — SACUBITRIL-VALSARTAN 24-26 MG PO TABS: ORAL_TABLET | ORAL | Status: DC

## 2015-12-29 NOTE — Patient Instructions (Signed)
Continue weighing daily and call for an overnight weight gain of > 2 pounds or a weekly weight gain of >5 pounds. 

## 2015-12-29 NOTE — Progress Notes (Signed)
Subjective:    Patient ID: Melissa Harrell, female    DOB: [birth year], 55 y.o.   MRN: [id number]  Congestive Heart Failure Presents for follow-up visit. The disease course has been stable. Associated symptoms include fatigue. Pertinent negatives include no abdominal pain, chest pain, chest pressure, edema, orthopnea, palpitations or shortness of breath. The symptoms have been stable. Past treatments include angiotensin receptor blockers, salt and fluid restriction and beta blockers. The treatment provided significant relief. Compliance with prior treatments has been good. Her past medical history is significant for chronic lung disease, DM and HTN. She has one 1st degree relative with heart disease. Compliance with total regimen is 76-100%.  Other This is a chronic problem. The current episode started more than 1 year ago. The problem occurs intermittently. The problem has been unchanged. Associated symptoms include arthralgias (left knee at time) and fatigue. Pertinent negatives include no abdominal pain, chest pain, congestion, coughing, headaches, myalgias, numbness, sore throat or weakness. The symptoms are aggravated by walking and standing. She has tried rest for the symptoms. The treatment provided mild relief.    Past Medical History  Diagnosis Date  . Diabetes mellitus without complication ([place])   . Hypertension   . Hyperlipidemia   . CHF (congestive heart failure) ([place])   . Asthma   . COPD (chronic obstructive pulmonary disease) ([place])   . Pulmonary emboli ([place])   . GERD (gastroesophageal reflux disease)   . Arthritis   . Depression   . Anxiety   . Breast cancer [facility]) [id number]    right     Past Surgical History  Procedure Laterality Date  . Abdominal hysterectomy    . Tubal ligation Bilateral   . Mastectomy Right 2006  . Breast excisional biopsy Right 2003    positive, chemo and radiation  . Breast biopsy Right 2006    positive    Family History  Problem Relation Age  of Onset  . Cancer - Cervical Maternal Grandmother   . Heart disease Mother   . Heart failure Sister   . Breast cancer Cousin     Social History  Substance Use Topics  . Smoking status: Never Smoker   . Smokeless tobacco: Never Used  . Alcohol Use: No    Allergies  Allergen Reactions  . Bee Venom Swelling  . Sulfa Antibiotics Hives  . Vicodin [Hydrocodone-Acetaminophen] Nausea And Vomiting  . Latex Itching and Rash  . Penicillins Itching    Prior to Admission medications   Medication Sig Start Date End Date Taking? Authorizing Provider  albuterol (PROVENTIL HFA;VENTOLIN HFA) 108 (90 BASE) MCG/ACT inhaler Inhale 2 puffs into the lungs every 6 (six) hours as needed for wheezing or shortness of breath.   Yes Historical Provider, MD  aspirin 81 MG tablet Take 81 mg by mouth daily.   Yes Historical Provider, MD  Calcium Carb-Cholecalciferol 600-200 MG-UNIT TABS Take 1 tablet by mouth 2 (two) times daily.   Yes Historical Provider, MD  carvedilol (COREG) 12.5 MG tablet Take 25 mg by mouth 2 (two) times daily with a meal.   Yes Historical Provider, MD  citalopram (CELEXA) 20 MG tablet Take 20 mg by mouth daily.   Yes Historical Provider, MD  furosemide (LASIX) 20 MG tablet Take 20 mg by mouth daily.   Yes Historical Provider, MD  glipiZIDE (GLUCOTROL XL) 10 MG 24 hr tablet Take 10 mg by mouth 2 (two) times daily.   Yes Historical Provider, MD  loratadine (CLARITIN) 10 MG tablet Take  10 mg by mouth daily.   Yes Historical Provider, MD  metFORMIN (GLUCOPHAGE) 500 MG tablet Take 1,000 mg by mouth 2 (two) times daily with a meal.   Yes Historical Provider, MD  omeprazole (PRILOSEC) 20 MG capsule Take 20 mg by mouth 2 (two) times daily before a meal.   Yes Historical Provider, MD  oxybutynin (DITROPAN) 5 MG tablet Take 5 mg by mouth 2 (two) times daily.   Yes Historical Provider, MD  sacubitril-valsartan (ENTRESTO) 24-26 MG Take 1 tablet twice daily 12/29/15  Yes Alisa Graff, FNP   simvastatin (ZOCOR) 40 MG tablet Take 40 mg by mouth daily.   Yes Historical Provider, MD  spironolactone (ALDACTONE) 25 MG tablet Take 25 mg by mouth daily.   Yes Historical Provider, MD     Review of Systems  Constitutional: Positive for fatigue. Negative for appetite change.  HENT: Negative for congestion, postnasal drip and sore throat.   Eyes: Negative.   Respiratory: Negative for cough, chest tightness and shortness of breath.   Cardiovascular: Negative for chest pain, palpitations and leg swelling.  Gastrointestinal: Negative for abdominal pain and abdominal distention.  Endocrine: Negative.   Genitourinary: Negative.   Musculoskeletal: Positive for arthralgias (left knee at time). Negative for myalgias.  Skin: Negative.   Allergic/Immunologic: Negative.   Neurological: Negative for dizziness, weakness, light-headedness, numbness and headaches.  Hematological: Negative for adenopathy. Bruises/bleeds easily.  Psychiatric/Behavioral: Positive for dysphoric mood (at times). Negative for suicidal ideas and sleep disturbance (sleeping on 1 pillow). The patient is not nervous/anxious.        Objective:   Physical Exam  Constitutional: She is oriented to person, place, and time. She appears well-developed and well-nourished.  HENT:  Head: Normocephalic and atraumatic.  Eyes: Conjunctivae are normal. Pupils are equal, round, and reactive to light.  Neck: Normal range of motion. Neck supple.  Cardiovascular: Regular rhythm.  Tachycardia present.   No murmur heard. Pulmonary/Chest: Effort normal. She has no wheezes. She has no rales.  Abdominal: Soft. She exhibits no distension. There is no tenderness.  Musculoskeletal: She exhibits no edema or tenderness.  Neurological: She is alert and oriented to person, place, and time.  Skin: Skin is warm and dry.  Psychiatric: She has a normal mood and affect. Her behavior is normal. Thought content normal.  Nursing note and vitals  reviewed.         Assessment & Plan:  1: Chronic heart failure with reduced ejection fraction- Patient presents with chronic fatigue upon exertion. She says that when she does get tired, she will stop what she's doing to rest for a few minutes until her symptoms improve. She says that she didn't get tired walking into the office today. Denies any shortness of breath or edema. She continues to weigh herself and reports a stable weight. By our scale, her weight is essentially unchanged from her last visit. Reminded to call for an overnight weight gain of >2 pounds or a weekly weight gain of >5 pounds. She is not adding any salt to her food and tries to follow a low sodium diet. Has already received her flu vaccine for this season. [facility] PharmD went in and reviewed medications with the patient.  2: Hypotension- Blood pressure looks good today. Has had a drop in blood pressure when medications were tried to be titrated up. 3: Diabetes- She says that she hasn't checked her glucose in a couple of weeks due to a lot of family issues going on. Last time  it was checked, she thinks it was in the 120's. Encouraged her to check it daily and emphasized the importance of taking care of herself. She sees her PCP 01/26/16.  Return here in 3 months or sooner for any questions/problems before then.

## 2016-03-28 ENCOUNTER — Encounter: Payer: Self-pay | Admitting: Family

## 2016-03-28 ENCOUNTER — Ambulatory Visit: Payer: Medicare Other | Attending: Family | Admitting: Family

## 2016-03-28 VITALS — BP 109/56 | HR 107 | Resp 18 | Ht 61.0 in | Wt 248.0 lb

## 2016-03-28 DIAGNOSIS — I110 Hypertensive heart disease with heart failure: Secondary | ICD-10-CM | POA: Diagnosis not present

## 2016-03-28 DIAGNOSIS — R000 Tachycardia, unspecified: Secondary | ICD-10-CM | POA: Diagnosis not present

## 2016-03-28 DIAGNOSIS — F329 Major depressive disorder, single episode, unspecified: Secondary | ICD-10-CM | POA: Insufficient documentation

## 2016-03-28 DIAGNOSIS — J45909 Unspecified asthma, uncomplicated: Secondary | ICD-10-CM | POA: Insufficient documentation

## 2016-03-28 DIAGNOSIS — Z853 Personal history of malignant neoplasm of breast: Secondary | ICD-10-CM | POA: Diagnosis not present

## 2016-03-28 DIAGNOSIS — E119 Type 2 diabetes mellitus without complications: Secondary | ICD-10-CM | POA: Diagnosis not present

## 2016-03-28 DIAGNOSIS — Z79899 Other long term (current) drug therapy: Secondary | ICD-10-CM | POA: Diagnosis not present

## 2016-03-28 DIAGNOSIS — K219 Gastro-esophageal reflux disease without esophagitis: Secondary | ICD-10-CM | POA: Diagnosis not present

## 2016-03-28 DIAGNOSIS — I5022 Chronic systolic (congestive) heart failure: Secondary | ICD-10-CM | POA: Diagnosis present

## 2016-03-28 DIAGNOSIS — Z885 Allergy status to narcotic agent status: Secondary | ICD-10-CM | POA: Diagnosis not present

## 2016-03-28 DIAGNOSIS — Z880 Allergy status to penicillin: Secondary | ICD-10-CM | POA: Diagnosis not present

## 2016-03-28 DIAGNOSIS — E785 Hyperlipidemia, unspecified: Secondary | ICD-10-CM | POA: Diagnosis not present

## 2016-03-28 DIAGNOSIS — F419 Anxiety disorder, unspecified: Secondary | ICD-10-CM | POA: Insufficient documentation

## 2016-03-28 DIAGNOSIS — Z7982 Long term (current) use of aspirin: Secondary | ICD-10-CM | POA: Diagnosis not present

## 2016-03-28 DIAGNOSIS — Z7984 Long term (current) use of oral hypoglycemic drugs: Secondary | ICD-10-CM | POA: Insufficient documentation

## 2016-03-28 DIAGNOSIS — Z86711 Personal history of pulmonary embolism: Secondary | ICD-10-CM | POA: Insufficient documentation

## 2016-03-28 DIAGNOSIS — J449 Chronic obstructive pulmonary disease, unspecified: Secondary | ICD-10-CM | POA: Diagnosis not present

## 2016-03-28 DIAGNOSIS — M1990 Unspecified osteoarthritis, unspecified site: Secondary | ICD-10-CM | POA: Insufficient documentation

## 2016-03-28 MED ORDER — CARVEDILOL 12.5 MG PO TABS: 12.5000 mg | ORAL_TABLET | Freq: Two times a day (BID) | ORAL | Status: DC

## 2016-03-28 NOTE — Patient Instructions (Signed)
Continue weighing daily and call for an overnight weight gain of > 2 pounds or a weekly weight gain of >5 pounds. 

## 2016-03-28 NOTE — Progress Notes (Signed)
Subjective:    Patient ID: Melissa Harrell, female    DOB: 01-15-1961, 55 y.o.   MRN: [id number]  Congestive Heart Failure Presents for follow-up visit. The disease course has been fluctuating. Associated symptoms include abdominal pain (yesterday) and palpitations (yesterday). Pertinent negatives include no chest pain, chest pressure, edema, orthopnea or shortness of breath. The symptoms have been stable. Past treatments include angiotensin receptor blockers, beta blockers, salt and fluid restriction, ACE inhibitors and aldosterone receptor blockers. The treatment provided moderate relief. Compliance with prior treatments has been variable. Her past medical history is significant for chronic lung disease, DM, HTN and PE. There is no history of CVA. She has one 1st degree relative with heart disease.  Palpitations  This is a recurrent problem. The current episode started more than 1 month ago. The problem occurs intermittently. The problem has been waxing and waning. The symptoms are aggravated by unknown. Associated symptoms include diaphoresis, an irregular heartbeat and malaise/fatigue. Pertinent negatives include no anxiety, chest fullness, chest pain, coughing, dizziness, shortness of breath or vomiting. She has tried beta blockers for the symptoms. The treatment provided significant relief. Risk factors include diabetes mellitus, dyslipidemia and obesity. Her past medical history is significant for anxiety and heart disease.   Past Medical History  Diagnosis Date  . Diabetes mellitus without complication ([place])   . Hypertension   . Hyperlipidemia   . CHF (congestive heart failure) ([place])   . Asthma   . COPD (chronic obstructive pulmonary disease) ([place])   . Pulmonary emboli ([place])   . GERD (gastroesophageal reflux disease)   . Arthritis   . Depression   . Anxiety   . Breast cancer [facility]) [id number]    right     Past Surgical History  Procedure Laterality Date  . Abdominal hysterectomy     . Tubal ligation Bilateral   . Mastectomy Right 2006  . Breast excisional biopsy Right 2003    positive, chemo and radiation  . Breast biopsy Right 2006    positive    Family History  Problem Relation Age of Onset  . Cancer - Cervical Maternal Grandmother   . Heart disease Mother   . Heart failure Sister   . Breast cancer Cousin     Social History  Substance Use Topics  . Smoking status: Never Smoker   . Smokeless tobacco: Never Used  . Alcohol Use: No    Allergies  Allergen Reactions  . Bee Venom Swelling  . Sulfa Antibiotics Hives  . Vicodin [Hydrocodone-Acetaminophen] Nausea And Vomiting  . Latex Itching and Rash  . Penicillins Itching    Prior to Admission medications   Medication Sig Start Date End Date Taking? Authorizing Provider  albuterol (PROVENTIL HFA;VENTOLIN HFA) 108 (90 BASE) MCG/ACT inhaler Inhale 2 puffs into the lungs every 6 (six) hours as needed for wheezing or shortness of breath.   Yes Historical Provider, MD  aspirin 81 MG tablet Take 81 mg by mouth daily.   Yes Historical Provider, MD  Calcium Carb-Cholecalciferol 600-200 MG-UNIT TABS Take 1 tablet by mouth 2 (two) times daily.   Yes Historical Provider, MD  carvedilol (COREG) 12.5 MG tablet Take 1 tablet (12.5 mg total) by mouth 2 (two) times daily with a meal. 03/28/16  Yes Alisa Graff, FNP  citalopram (CELEXA) 20 MG tablet Take 20 mg by mouth daily.   Yes Historical Provider, MD  furosemide (LASIX) 20 MG tablet Take 20 mg by mouth daily.   Yes Historical Provider, MD  glipiZIDE (GLUCOTROL XL) 10 MG 24 hr tablet Take 10 mg by mouth 2 (two) times daily.   Yes Historical Provider, MD  loratadine (CLARITIN) 10 MG tablet Take 10 mg by mouth daily.   Yes Historical Provider, MD  metFORMIN (GLUCOPHAGE) 500 MG tablet Take 500 mg by mouth 2 (two) times daily with a meal.    Yes Historical Provider, MD  omeprazole (PRILOSEC) 20 MG capsule Take 20 mg by mouth 2 (two) times daily before a meal.   Yes  Historical Provider, MD  oxybutynin (DITROPAN) 5 MG tablet Take 5 mg by mouth 2 (two) times daily.   Yes Historical Provider, MD  sacubitril-valsartan (ENTRESTO) 24-26 MG Take 1 tablet twice daily 12/29/15  Yes Alisa Graff, FNP  simvastatin (ZOCOR) 40 MG tablet Take 40 mg by mouth daily.   Yes Historical Provider, MD  spironolactone (ALDACTONE) 25 MG tablet Take 25 mg by mouth daily.   Yes Historical Provider, MD      Review of Systems  Constitutional: Positive for malaise/fatigue and diaphoresis. Negative for appetite change.  HENT: Negative for congestion, rhinorrhea and sore throat.   Eyes: Negative.   Respiratory: Negative for cough, chest tightness, shortness of breath and wheezing.   Cardiovascular: Positive for palpitations (yesterday). Negative for chest pain and leg swelling.  Gastrointestinal: Positive for abdominal pain (yesterday) and constipation. Negative for vomiting and abdominal distention.  Endocrine: Negative.   Genitourinary: Negative.   Musculoskeletal: Positive for back pain. Negative for neck pain.  Skin: Negative.   Allergic/Immunologic: Negative.   Neurological: Negative for dizziness, light-headedness and headaches.  Hematological: Negative for adenopathy. Does not bruise/bleed easily.  Psychiatric/Behavioral: Negative for sleep disturbance (sleeping on 1 pillow) and dysphoric mood. The patient is not nervous/anxious.        Objective:   Physical Exam  Constitutional: She is oriented to person, place, and time. She appears well-developed and well-nourished.  HENT:  Head: Normocephalic and atraumatic.  Eyes: Conjunctivae are normal. Pupils are equal, round, and reactive to light.  Neck: Normal range of motion. Neck supple.  Cardiovascular: Regular rhythm.  Tachycardia present.   Pulmonary/Chest: Effort normal. She has no wheezes. She has no rales.  Abdominal: Soft. She exhibits no distension. There is no tenderness.  Musculoskeletal: She exhibits no  edema or tenderness.  Neurological: She is alert and oriented to person, place, and time.  Skin: Skin is warm and dry.  Psychiatric: She has a normal mood and affect. Her behavior is normal. Thought content normal.  Nursing note and vitals reviewed.   BP 109/56 mmHg  Pulse 107  Resp 18  Ht 5\' 1"  (1.549 m)  Wt 248 lb (112.492 kg)  BMI 46.88 kg/m2  SpO2 100%  LMP  (LMP Unknown)       Assessment & Plan:  1: Chronic heart failure with reduced ejection fraction- Patient presents with fatigue upon exertion. She denies any shortness of breath or swelling in her legs or abdomen. She has noticed some palpitations and after verbally reviewing her medication list, she says that she hasn't been taking the carvedilol for "probably the last month or so". She's unsure as to what happened with her taking it. New prescription sent in today and she was advised to bring medications to every visit. She continues to weigh herself and says that her weight has been stable which is frustrating to her since she doesn't feel like she eats too much. She admits that she's not terribly active though and she was encouraged  to increase her activity. By our scale, she's gained 1 pound since she was last here. Reminded to call for an overnight weight gain of >2 pounds or a weekly weight gain of >5 pounds. She is not adding any salt to her food and is trying to eat low sodium foods. [facility] PharmD went in and verbally reviewed medications with the patient. Sees her cardiologist in the fall of 2017. 2: Tachycardia- Heart rate in the 100's today but since she's not been taking her carvedilol, it's no surprise. Will resume her carvedilol at 12.5mg  twice daily instead of the 25mg  twice daily that she was taking. Discussed the importance of not running out of her medications. She feels like not having the carvedilol may be why she's had a couple of episodes of palpitations and of not "feeling right".  3: Diabetes- She says that her  glucose yesterday was 102. She continues to take glipizide and metformin. Is going to call her PCP to get an appointment scheduled.  Patient did not bring her medications nor a list. Each medication was verbally reviewed with the patient and she was encouraged to bring the bottles to every visit to confirm accuracy of list.  Return here in 1 month or sooner for any questions/problems before then.

## 2016-04-26 ENCOUNTER — Ambulatory Visit: Payer: Medicare Other | Attending: Family | Admitting: Family

## 2016-04-26 ENCOUNTER — Encounter: Payer: Self-pay | Admitting: Family

## 2016-04-26 VITALS — BP 96/41 | HR 84 | Resp 20 | Ht 61.0 in | Wt 254.0 lb

## 2016-04-26 DIAGNOSIS — Z882 Allergy status to sulfonamides status: Secondary | ICD-10-CM | POA: Diagnosis not present

## 2016-04-26 DIAGNOSIS — M1990 Unspecified osteoarthritis, unspecified site: Secondary | ICD-10-CM | POA: Insufficient documentation

## 2016-04-26 DIAGNOSIS — J449 Chronic obstructive pulmonary disease, unspecified: Secondary | ICD-10-CM | POA: Insufficient documentation

## 2016-04-26 DIAGNOSIS — Z90710 Acquired absence of both cervix and uterus: Secondary | ICD-10-CM | POA: Diagnosis not present

## 2016-04-26 DIAGNOSIS — Z79899 Other long term (current) drug therapy: Secondary | ICD-10-CM | POA: Diagnosis not present

## 2016-04-26 DIAGNOSIS — Z7982 Long term (current) use of aspirin: Secondary | ICD-10-CM | POA: Diagnosis not present

## 2016-04-26 DIAGNOSIS — F419 Anxiety disorder, unspecified: Secondary | ICD-10-CM | POA: Diagnosis not present

## 2016-04-26 DIAGNOSIS — I10 Essential (primary) hypertension: Secondary | ICD-10-CM | POA: Insufficient documentation

## 2016-04-26 DIAGNOSIS — Z880 Allergy status to penicillin: Secondary | ICD-10-CM | POA: Insufficient documentation

## 2016-04-26 DIAGNOSIS — Z885 Allergy status to narcotic agent status: Secondary | ICD-10-CM | POA: Diagnosis not present

## 2016-04-26 DIAGNOSIS — F329 Major depressive disorder, single episode, unspecified: Secondary | ICD-10-CM | POA: Diagnosis not present

## 2016-04-26 DIAGNOSIS — Z853 Personal history of malignant neoplasm of breast: Secondary | ICD-10-CM | POA: Diagnosis not present

## 2016-04-26 DIAGNOSIS — Z86711 Personal history of pulmonary embolism: Secondary | ICD-10-CM | POA: Diagnosis not present

## 2016-04-26 DIAGNOSIS — S46212A Strain of muscle, fascia and tendon of other parts of biceps, left arm, initial encounter: Secondary | ICD-10-CM

## 2016-04-26 DIAGNOSIS — Z91030 Bee allergy status: Secondary | ICD-10-CM | POA: Diagnosis not present

## 2016-04-26 DIAGNOSIS — J45909 Unspecified asthma, uncomplicated: Secondary | ICD-10-CM | POA: Diagnosis not present

## 2016-04-26 DIAGNOSIS — I952 Hypotension due to drugs: Secondary | ICD-10-CM

## 2016-04-26 DIAGNOSIS — Z7984 Long term (current) use of oral hypoglycemic drugs: Secondary | ICD-10-CM | POA: Insufficient documentation

## 2016-04-26 DIAGNOSIS — E785 Hyperlipidemia, unspecified: Secondary | ICD-10-CM | POA: Insufficient documentation

## 2016-04-26 DIAGNOSIS — M79603 Pain in arm, unspecified: Secondary | ICD-10-CM | POA: Diagnosis not present

## 2016-04-26 DIAGNOSIS — I959 Hypotension, unspecified: Secondary | ICD-10-CM | POA: Insufficient documentation

## 2016-04-26 DIAGNOSIS — Z91040 Latex allergy status: Secondary | ICD-10-CM | POA: Diagnosis not present

## 2016-04-26 DIAGNOSIS — Z8249 Family history of ischemic heart disease and other diseases of the circulatory system: Secondary | ICD-10-CM | POA: Diagnosis not present

## 2016-04-26 DIAGNOSIS — K219 Gastro-esophageal reflux disease without esophagitis: Secondary | ICD-10-CM | POA: Insufficient documentation

## 2016-04-26 DIAGNOSIS — E119 Type 2 diabetes mellitus without complications: Secondary | ICD-10-CM | POA: Insufficient documentation

## 2016-04-26 DIAGNOSIS — I5022 Chronic systolic (congestive) heart failure: Secondary | ICD-10-CM | POA: Diagnosis present

## 2016-04-26 NOTE — Progress Notes (Signed)
Subjective:    Patient ID: Melissa Harrell, female    DOB: [birth year], 55 y.o.   MRN: [id number]  Congestive Heart Failure Presents for follow-up visit. The disease course has been improving. Pertinent negatives include no abdominal pain, chest pain, chest pressure, edema, fatigue, muscle weakness, orthopnea, palpitations or shortness of breath. The symptoms have been improving. Past treatments include beta blockers, angiotensin receptor blockers, salt and fluid restriction and aldosterone receptor blockers. The treatment provided significant relief. Compliance with prior treatments has been good. Her past medical history is significant for chronic lung disease, DM and HTN. There is no history of CAD or CVA. She has one 1st degree relative with heart disease.  Other This is a chronic (hypotension) problem. The current episode started more than 1 year ago. The problem occurs daily. The problem has been unchanged. Associated symptoms include arthralgias (left upper arm and left lower leg). Pertinent negatives include no abdominal pain, chest pain, congestion, coughing, fatigue, headaches, sore throat or weakness. The symptoms are aggravated by bending. She has tried position changes for the symptoms. The treatment provided moderate relief.   Past Medical History  Diagnosis Date  . Diabetes mellitus without complication ([place])   . Hypertension   . Hyperlipidemia   . CHF (congestive heart failure) ([place])   . Asthma   . COPD (chronic obstructive pulmonary disease) ([place])   . Pulmonary emboli ([place])   . GERD (gastroesophageal reflux disease)   . Arthritis   . Depression   . Anxiety   . Breast cancer [facility]) [id number]    right     Past Surgical History  Procedure Laterality Date  . Abdominal hysterectomy    . Tubal ligation Bilateral   . Mastectomy Right 2006  . Breast excisional biopsy Right 2003    positive, chemo and radiation  . Breast biopsy Right 2006    positive    Family History   Problem Relation Age of Onset  . Cancer - Cervical Maternal Grandmother   . Heart disease Mother   . Heart failure Sister   . Breast cancer Cousin     Social History  Substance Use Topics  . Smoking status: Never Smoker   . Smokeless tobacco: Never Used  . Alcohol Use: No    Allergies  Allergen Reactions  . Bee Venom Swelling  . Sulfa Antibiotics Hives  . Vicodin [Hydrocodone-Acetaminophen] Nausea And Vomiting  . Latex Itching and Rash  . Penicillins Itching    Prior to Admission medications   Medication Sig Start Date End Date Taking? Authorizing Provider  albuterol (PROVENTIL HFA;VENTOLIN HFA) 108 (90 BASE) MCG/ACT inhaler Inhale 2 puffs into the lungs every 6 (six) hours as needed for wheezing or shortness of breath.   Yes Historical Provider, MD  aspirin 81 MG tablet Take 81 mg by mouth daily.   Yes Historical Provider, MD  Calcium Carb-Cholecalciferol 600-200 MG-UNIT TABS Take 1 tablet by mouth 2 (two) times daily.   Yes Historical Provider, MD  carvedilol (COREG) 12.5 MG tablet Take 1 tablet (12.5 mg total) by mouth 2 (two) times daily with a meal. 03/28/16  Yes Alisa Graff, FNP  citalopram (CELEXA) 20 MG tablet Take 20 mg by mouth daily.   Yes Historical Provider, MD  furosemide (LASIX) 20 MG tablet Take 20 mg by mouth daily.   Yes Historical Provider, MD  gabapentin (NEURONTIN) 300 MG capsule Take 300 mg by mouth at bedtime.   Yes Historical Provider, MD  glipiZIDE (GLUCOTROL XL) 10  MG 24 hr tablet Take 10 mg by mouth 2 (two) times daily.   Yes Historical Provider, MD  loratadine (CLARITIN) 10 MG tablet Take 10 mg by mouth daily.   Yes Historical Provider, MD  magnesium oxide (MAG-OX) 400 MG tablet Take 400 mg by mouth daily.   Yes Historical Provider, MD  metFORMIN (GLUCOPHAGE) 500 MG tablet Take 500 mg by mouth 2 (two) times daily with a meal.    Yes Historical Provider, MD  Omega 3 1000 MG CAPS Take 1 capsule by mouth daily.   Yes Historical Provider, MD   omeprazole (PRILOSEC) 20 MG capsule Take 20 mg by mouth 2 (two) times daily before a meal.   Yes Historical Provider, MD  oxybutynin (DITROPAN) 5 MG tablet Take 5 mg by mouth 2 (two) times daily.   Yes Historical Provider, MD  sacubitril-valsartan (ENTRESTO) 24-26 MG Take 1 tablet twice daily 12/29/15  Yes Alisa Graff, FNP  simvastatin (ZOCOR) 40 MG tablet Take 40 mg by mouth daily.   Yes Historical Provider, MD  spironolactone (ALDACTONE) 25 MG tablet Take 25 mg by mouth daily.   Yes Historical Provider, MD      Review of Systems  Constitutional: Negative for appetite change and fatigue.  HENT: Negative for congestion, postnasal drip and sore throat.   Eyes: Negative.   Respiratory: Negative for cough, chest tightness and shortness of breath.   Cardiovascular: Negative for chest pain and palpitations.  Gastrointestinal: Negative for abdominal pain and abdominal distention.  Endocrine: Negative.   Genitourinary: Negative.   Musculoskeletal: Positive for arthralgias (left upper arm and left lower leg). Negative for back pain and muscle weakness.  Skin: Negative.   Allergic/Immunologic: Negative.   Neurological: Negative for dizziness, weakness, light-headedness and headaches.  Hematological: Negative for adenopathy. Does not bruise/bleed easily.  Psychiatric/Behavioral: Negative for sleep disturbance (sleeping on 1 pillow) and dysphoric mood. The patient is not nervous/anxious.        Objective:   Physical Exam  Constitutional: She is oriented to person, place, and time. She appears well-developed and well-nourished.  HENT:  Head: Normocephalic and atraumatic.  Eyes: Conjunctivae are normal. Pupils are equal, round, and reactive to light.  Neck: Normal range of motion. Neck supple.  Cardiovascular: Normal rate and regular rhythm.   Pulmonary/Chest: Effort normal. She has no wheezes. She has no rales.  Abdominal: Soft. She exhibits no distension. There is no tenderness.   Musculoskeletal: She exhibits tenderness (left biceps). She exhibits no edema.       Left lower leg: She exhibits no tenderness, no swelling and no edema.  Neurological: She is alert and oriented to person, place, and time.  Skin: Skin is warm and dry.  Psychiatric: She has a normal mood and affect. Her behavior is normal. Thought content normal.  Nursing note and vitals reviewed.   BP 96/41 mmHg  Pulse 84  Resp 20  Ht 5\' 1"  (1.549 m)  Wt 254 lb (115.214 kg)  BMI 48.02 kg/m2  SpO2 99%  LMP  (LMP Unknown)       Assessment & Plan:  1: Chronic heart failure with reduced ejection fraction- Patient presents without any shortness of breath, fatigue or swelling in her legs/abdomen. She continues to weigh herself and says that her weight is slowly increasing but says that she hasn't been able to walk outside much due to all the rain. By our scale, she has gained 6 pounds since she was last here on 03/28/16. Reminded her to call  for an overnight weight gain of >2 pounds or a weekly weight gain of >5 pounds. She is not adding any salt to her food and tries to eat low sodium foods. She has resumed her carvedilol since she was last here and reports feeling better. [facility] PharmD went in and reviewed medications with the patient. 2: Hypotension- Due to her continued low blood pressure, unable to titrate up medications. Denies any dizziness with position changes. 3: Diabetes- She says that her glucose this morning was 123. Follows with her PCP regarding her diabetes. 4: Left biceps pain- She says that her left upper arm feels "sore" as well as behind her left knee. She says that she feels like she stepped wrong off a step and that's when behind her knee started to feel sore. Encouraged her to try applying heat to see if that helps and follow up with her PCP if it doesn't improve.   Medication bottles were all reviewed.  Return here in 3 months or sooner for any questions/problems before then.

## 2016-04-26 NOTE — Patient Instructions (Signed)
Continue weighing daily and call for an overnight weight gain of > 2 pounds or a weekly weight gain of >5 pounds. 

## 2016-06-14 ENCOUNTER — Other Ambulatory Visit: Payer: Self-pay | Admitting: Family Medicine

## 2016-06-14 DIAGNOSIS — Z1231 Encounter for screening mammogram for malignant neoplasm of breast: Secondary | ICD-10-CM

## 2016-06-27 ENCOUNTER — Ambulatory Visit: Payer: Medicare Other | Admitting: Family

## 2016-07-12 ENCOUNTER — Telehealth: Payer: Self-pay | Admitting: *Deleted

## 2016-07-12 NOTE — Telephone Encounter (Signed)
escribed to pharmacy

## 2016-07-25 ENCOUNTER — Other Ambulatory Visit: Payer: Self-pay | Admitting: Family Medicine

## 2016-07-25 ENCOUNTER — Ambulatory Visit
Admission: RE | Admit: 2016-07-25 | Discharge: 2016-07-25 | Disposition: A | Discharge status: Home / Self Care | Payer: Medicare Other | Source: Ambulatory Visit | Attending: Family Medicine | Admitting: Family Medicine

## 2016-07-25 DIAGNOSIS — Z1231 Encounter for screening mammogram for malignant neoplasm of breast: Secondary | ICD-10-CM | POA: Diagnosis not present

## 2016-07-26 ENCOUNTER — Ambulatory Visit: Payer: Medicare Other | Admitting: Family

## 2016-08-04 ENCOUNTER — Other Ambulatory Visit: Payer: Self-pay | Admitting: Family

## 2016-08-04 MED ORDER — SACUBITRIL-VALSARTAN 24-26 MG PO TABS: ORAL_TABLET | ORAL | 5 refills | Status: DC

## 2016-08-08 ENCOUNTER — Other Ambulatory Visit: Payer: Self-pay | Admitting: Family Medicine

## 2016-08-08 DIAGNOSIS — M8580 Other specified disorders of bone density and structure, unspecified site: Secondary | ICD-10-CM

## 2016-08-21 ENCOUNTER — Ambulatory Visit
Admission: RE | Admit: 2016-08-21 | Discharge: 2016-08-21 | Disposition: A | Discharge status: Home / Self Care | Payer: Medicare Other | Source: Ambulatory Visit | Attending: Family Medicine | Admitting: Family Medicine

## 2016-08-21 ENCOUNTER — Other Ambulatory Visit: Payer: Self-pay | Admitting: Family Medicine

## 2016-08-21 DIAGNOSIS — M170 Bilateral primary osteoarthritis of knee: Secondary | ICD-10-CM | POA: Insufficient documentation

## 2016-08-21 DIAGNOSIS — M25561 Pain in right knee: Secondary | ICD-10-CM | POA: Diagnosis present

## 2016-08-21 DIAGNOSIS — M25569 Pain in unspecified knee: Secondary | ICD-10-CM

## 2016-08-21 DIAGNOSIS — M25562 Pain in left knee: Secondary | ICD-10-CM | POA: Insufficient documentation

## 2016-08-24 ENCOUNTER — Encounter: Payer: Self-pay | Admitting: Family

## 2016-08-24 ENCOUNTER — Ambulatory Visit: Payer: Medicare Other | Attending: Family | Admitting: Family

## 2016-08-24 VITALS — BP 100/70 | HR 82 | Resp 18 | Ht 61.0 in | Wt 259.0 lb

## 2016-08-24 DIAGNOSIS — F419 Anxiety disorder, unspecified: Secondary | ICD-10-CM | POA: Insufficient documentation

## 2016-08-24 DIAGNOSIS — I110 Hypertensive heart disease with heart failure: Secondary | ICD-10-CM | POA: Diagnosis not present

## 2016-08-24 DIAGNOSIS — J449 Chronic obstructive pulmonary disease, unspecified: Secondary | ICD-10-CM | POA: Insufficient documentation

## 2016-08-24 DIAGNOSIS — K219 Gastro-esophageal reflux disease without esophagitis: Secondary | ICD-10-CM | POA: Insufficient documentation

## 2016-08-24 DIAGNOSIS — E785 Hyperlipidemia, unspecified: Secondary | ICD-10-CM | POA: Diagnosis not present

## 2016-08-24 DIAGNOSIS — I5022 Chronic systolic (congestive) heart failure: Secondary | ICD-10-CM | POA: Diagnosis not present

## 2016-08-24 DIAGNOSIS — Z86711 Personal history of pulmonary embolism: Secondary | ICD-10-CM | POA: Insufficient documentation

## 2016-08-24 DIAGNOSIS — M1990 Unspecified osteoarthritis, unspecified site: Secondary | ICD-10-CM | POA: Diagnosis not present

## 2016-08-24 DIAGNOSIS — Z853 Personal history of malignant neoplasm of breast: Secondary | ICD-10-CM | POA: Insufficient documentation

## 2016-08-24 DIAGNOSIS — M25561 Pain in right knee: Secondary | ICD-10-CM | POA: Insufficient documentation

## 2016-08-24 DIAGNOSIS — Z7982 Long term (current) use of aspirin: Secondary | ICD-10-CM | POA: Diagnosis not present

## 2016-08-24 DIAGNOSIS — E119 Type 2 diabetes mellitus without complications: Secondary | ICD-10-CM | POA: Diagnosis not present

## 2016-08-24 DIAGNOSIS — M25562 Pain in left knee: Secondary | ICD-10-CM | POA: Diagnosis not present

## 2016-08-24 DIAGNOSIS — Z7984 Long term (current) use of oral hypoglycemic drugs: Secondary | ICD-10-CM | POA: Diagnosis not present

## 2016-08-24 DIAGNOSIS — F329 Major depressive disorder, single episode, unspecified: Secondary | ICD-10-CM | POA: Insufficient documentation

## 2016-08-24 DIAGNOSIS — I952 Hypotension due to drugs: Secondary | ICD-10-CM

## 2016-08-24 DIAGNOSIS — I509 Heart failure, unspecified: Secondary | ICD-10-CM | POA: Diagnosis present

## 2016-08-24 DIAGNOSIS — I959 Hypotension, unspecified: Secondary | ICD-10-CM | POA: Diagnosis not present

## 2016-08-24 NOTE — Progress Notes (Signed)
Patient ID: Melissa Harrell, female    DOB: [birth year], 55 y.o.   MRN: [id number]  HPI Melissa Harrell is a 55 year old female with a history of morbid obesity, COPD, diabetes, HTN, pulmonary emboli and systolic heart failure who returns today for a f/u visit.   Last echo 03/14/15 with an EF of 45% without any stenosis or regurgitation.   Has not been admitted to the hospital in the last couple of years.   Returns today for f/u. Weight has climbed gradually and she feels like it's because she hasn't been as active due to bilateral knee pain. No edema or shortness of breath reported. Chronic fatigue. BP remains on the low side but no dizziness. Last saw her cardiologist on 01/24/16.   Past Medical History:  Diagnosis Date  . Anxiety   . Arthritis   . Asthma   . Breast cancer ([place]) [id number]   right   . CHF (congestive heart failure) ([place])   . COPD (chronic obstructive pulmonary disease) ([place])   . Depression   . Diabetes mellitus without complication ([place])   . GERD (gastroesophageal reflux disease)   . Hyperlipidemia   . Hypertension   . Pulmonary emboli [facility])     Past Surgical History:  Procedure Laterality Date  . ABDOMINAL HYSTERECTOMY    . BREAST BIOPSY Right 2006   positive  . BREAST EXCISIONAL BIOPSY Right 2003   positive, chemo and radiation  . MASTECTOMY Right 2006  . TUBAL LIGATION Bilateral     Family History  Problem Relation Age of Onset  . Cancer - Cervical Maternal Grandmother   . Heart disease Mother   . Heart failure Sister   . Breast cancer Cousin     Social History  Substance Use Topics  . Smoking status: Never Smoker  . Smokeless tobacco: Never Used  . Alcohol use No    Allergies  Allergen Reactions  . Bee Venom Swelling  . Sulfa Antibiotics Hives  . Vicodin [Hydrocodone-Acetaminophen] Nausea And Vomiting  . Latex Itching and Rash  . Penicillins Itching    Prior to Admission medications   Medication Sig Start Date End Date Taking? Authorizing  Provider  albuterol (PROVENTIL HFA;VENTOLIN HFA) 108 (90 BASE) MCG/ACT inhaler Inhale 2 puffs into the lungs every 6 (six) hours as needed for wheezing or shortness of breath.   Yes Historical Provider, MD  aspirin 81 MG tablet Take 81 mg by mouth daily.   Yes Historical Provider, MD  BIOTIN PO Take 500 mcg by mouth daily.   Yes Historical Provider, MD  Calcium Carb-Cholecalciferol 600-200 MG-UNIT TABS Take 1 tablet by mouth 2 (two) times daily.   Yes Historical Provider, MD  carvedilol (COREG) 12.5 MG tablet Take 1 tablet (12.5 mg total) by mouth 2 (two) times daily with a meal. 03/28/16  Yes Alisa Graff, FNP  citalopram (CELEXA) 20 MG tablet Take 20 mg by mouth daily.   Yes Historical Provider, MD  furosemide (LASIX) 20 MG tablet Take 20 mg by mouth daily.   Yes Historical Provider, MD  gabapentin (NEURONTIN) 300 MG capsule Take 300 mg by mouth at bedtime.   Yes Historical Provider, MD  glipiZIDE (GLUCOTROL XL) 10 MG 24 hr tablet Take 10 mg by mouth 2 (two) times daily.   Yes Historical Provider, MD  loratadine (CLARITIN) 10 MG tablet Take 10 mg by mouth daily.   Yes Historical Provider, MD  magnesium oxide (MAG-OX) 400 MG tablet Take 400 mg by mouth daily.  Yes Historical Provider, MD  metFORMIN (GLUCOPHAGE) 500 MG tablet Take 500 mg by mouth 2 (two) times daily with a meal.    Yes Historical Provider, MD  Omega 3 1000 MG CAPS Take 1 capsule by mouth daily.   Yes Historical Provider, MD  omeprazole (PRILOSEC) 20 MG capsule Take 20 mg by mouth 2 (two) times daily before a meal.   Yes Historical Provider, MD  oxybutynin (DITROPAN) 5 MG tablet Take 5 mg by mouth 2 (two) times daily.   Yes Historical Provider, MD  sacubitril-valsartan (ENTRESTO) 24-26 MG Take 1 tablet twice daily 08/04/16  Yes Alisa Graff, FNP  simvastatin (ZOCOR) 40 MG tablet Take 40 mg by mouth daily.   Yes Historical Provider, MD  spironolactone (ALDACTONE) 25 MG tablet Take 25 mg by mouth daily.   Yes Historical Provider, MD   vitamin C (ASCORBIC ACID) 500 MG tablet Take 500 mg by mouth daily.   Yes Historical Provider, MD    Review of Systems  Constitutional: Positive for fatigue. Negative for appetite change.  HENT: Negative for congestion, postnasal drip and sore throat.   Eyes: Negative.   Respiratory: Negative for cough, chest tightness and shortness of breath.   Cardiovascular: Negative for chest pain, palpitations and leg swelling.  Gastrointestinal: Negative for abdominal distention and abdominal pain.  Endocrine: Negative.   Genitourinary: Negative.   Musculoskeletal: Positive for arthralgias (both knees). Negative for back pain.  Skin: Negative.   Allergic/Immunologic: Negative.   Neurological: Negative for dizziness and light-headedness.  Hematological: Negative for adenopathy. Does not bruise/bleed easily.  Psychiatric/Behavioral: Negative for dysphoric mood and sleep disturbance (sleeping on 1 pillow). The patient is not nervous/anxious.    Vitals:   08/24/16 0848 08/24/16 0851  BP: (!) 79/39 100/70  Pulse: 82   Resp: 18   SpO2: 100%   Weight: 259 lb (117.5 kg)   Height: 5\' 1"  (1.549 m)      Physical Exam  Constitutional: She is oriented to person, place, and time. She appears well-developed and well-nourished.  HENT:  Head: Normocephalic and atraumatic.  Eyes: Conjunctivae are normal. Pupils are equal, round, and reactive to light.  Neck: Normal range of motion. Neck supple.  Cardiovascular: Normal rate and regular rhythm.   Pulmonary/Chest: Effort normal. She has no wheezes. She has no rales.  Abdominal: Soft. She exhibits no distension. There is no tenderness.  Musculoskeletal: She exhibits no edema or tenderness.  Neurological: She is alert and oriented to person, place, and time.  Skin: Skin is warm and dry.  Psychiatric: She has a normal mood and affect. Her behavior is normal. Thought content normal.  Vitals reviewed.  Assessment & Plan  1: Chronic heart failure with  reduced ejection fraction-  -Last echo done 03/14/15 with an EF of 45%. Need to discuss getting her echocardiogram repeated.  -NYHA Class II today -Volume status stable  -Weight elevated 5 pounds from last visit here. Encouraged increased ambulation or water aerobics since having knee pain. To call for overnight weight gain of >2 pounds or weekly weight gain of >5 pounds.  [facility] PharmD went in and reviewed medications bottles. -Received flu vaccine for this year -Currently on entresto, carvedilol, furosemide and spironolactone.   2: Hypotension- -BP remains low but without dizziness. Recheck with manual cuff and was improved. -Unable to titrate entresto or carvedilol due to hypotension  3: Diabetes without long-term insulin use- -Glucose yesterday was 120.  -Continues on glipizide and metformin.  -Follows closely with PCP about  this.  4: Bilateral knee pain- - Had xrays of knees done by her PCP.   Return in 3 months or sooner for any questions/problems before then.

## 2016-08-24 NOTE — Patient Instructions (Signed)
Continue weighing daily and call for an overnight weight gain of > 2 pounds or a weekly weight gain of >5 pounds. 

## 2016-09-03 ENCOUNTER — Ambulatory Visit
Admission: RE | Admit: 2016-09-03 | Discharge: 2016-09-03 | Disposition: A | Discharge status: Home / Self Care | Payer: Medicare Other | Source: Ambulatory Visit | Attending: Family Medicine | Admitting: Family Medicine

## 2016-09-03 DIAGNOSIS — M8580 Other specified disorders of bone density and structure, unspecified site: Secondary | ICD-10-CM | POA: Insufficient documentation

## 2016-09-03 DIAGNOSIS — Z13820 Encounter for screening for osteoporosis: Secondary | ICD-10-CM | POA: Insufficient documentation

## 2016-10-02 ENCOUNTER — Other Ambulatory Visit: Payer: Self-pay | Admitting: Family

## 2016-10-02 MED ORDER — SACUBITRIL-VALSARTAN 24-26 MG PO TABS: ORAL_TABLET | ORAL | 3 refills | Status: DC

## 2016-10-02 MED ORDER — CARVEDILOL 12.5 MG PO TABS: 12.5000 mg | ORAL_TABLET | Freq: Two times a day (BID) | ORAL | 3 refills | Status: DC

## 2016-11-15 ENCOUNTER — Ambulatory Visit: Payer: Medicare Other | Admitting: Family

## 2016-12-11 ENCOUNTER — Ambulatory Visit: Payer: Medicare Other | Admitting: Family

## 2016-12-19 ENCOUNTER — Ambulatory Visit: Payer: Medicare Other | Admitting: Family

## 2016-12-27 ENCOUNTER — Encounter: Payer: Self-pay | Admitting: Family

## 2016-12-27 ENCOUNTER — Ambulatory Visit: Payer: Medicare Other | Attending: Family | Admitting: Family

## 2016-12-27 VITALS — BP 104/50 | HR 87 | Resp 18 | Ht 61.0 in | Wt 260.4 lb

## 2016-12-27 DIAGNOSIS — M25561 Pain in right knee: Secondary | ICD-10-CM | POA: Insufficient documentation

## 2016-12-27 DIAGNOSIS — Z7982 Long term (current) use of aspirin: Secondary | ICD-10-CM | POA: Diagnosis not present

## 2016-12-27 DIAGNOSIS — G8929 Other chronic pain: Secondary | ICD-10-CM

## 2016-12-27 DIAGNOSIS — Z880 Allergy status to penicillin: Secondary | ICD-10-CM | POA: Insufficient documentation

## 2016-12-27 DIAGNOSIS — F329 Major depressive disorder, single episode, unspecified: Secondary | ICD-10-CM | POA: Diagnosis not present

## 2016-12-27 DIAGNOSIS — Z803 Family history of malignant neoplasm of breast: Secondary | ICD-10-CM | POA: Insufficient documentation

## 2016-12-27 DIAGNOSIS — E119 Type 2 diabetes mellitus without complications: Secondary | ICD-10-CM | POA: Insufficient documentation

## 2016-12-27 DIAGNOSIS — Z86711 Personal history of pulmonary embolism: Secondary | ICD-10-CM | POA: Diagnosis not present

## 2016-12-27 DIAGNOSIS — F419 Anxiety disorder, unspecified: Secondary | ICD-10-CM | POA: Insufficient documentation

## 2016-12-27 DIAGNOSIS — M25562 Pain in left knee: Secondary | ICD-10-CM | POA: Diagnosis not present

## 2016-12-27 DIAGNOSIS — I110 Hypertensive heart disease with heart failure: Secondary | ICD-10-CM | POA: Diagnosis not present

## 2016-12-27 DIAGNOSIS — Z888 Allergy status to other drugs, medicaments and biological substances status: Secondary | ICD-10-CM | POA: Diagnosis not present

## 2016-12-27 DIAGNOSIS — K219 Gastro-esophageal reflux disease without esophagitis: Secondary | ICD-10-CM | POA: Diagnosis not present

## 2016-12-27 DIAGNOSIS — E785 Hyperlipidemia, unspecified: Secondary | ICD-10-CM | POA: Insufficient documentation

## 2016-12-27 DIAGNOSIS — Z853 Personal history of malignant neoplasm of breast: Secondary | ICD-10-CM | POA: Diagnosis not present

## 2016-12-27 DIAGNOSIS — Z808 Family history of malignant neoplasm of other organs or systems: Secondary | ICD-10-CM | POA: Insufficient documentation

## 2016-12-27 DIAGNOSIS — I952 Hypotension due to drugs: Secondary | ICD-10-CM

## 2016-12-27 DIAGNOSIS — Z9221 Personal history of antineoplastic chemotherapy: Secondary | ICD-10-CM | POA: Diagnosis not present

## 2016-12-27 DIAGNOSIS — Z923 Personal history of irradiation: Secondary | ICD-10-CM | POA: Diagnosis not present

## 2016-12-27 DIAGNOSIS — Z885 Allergy status to narcotic agent status: Secondary | ICD-10-CM | POA: Insufficient documentation

## 2016-12-27 DIAGNOSIS — I5022 Chronic systolic (congestive) heart failure: Secondary | ICD-10-CM | POA: Diagnosis present

## 2016-12-27 DIAGNOSIS — Z7984 Long term (current) use of oral hypoglycemic drugs: Secondary | ICD-10-CM | POA: Insufficient documentation

## 2016-12-27 DIAGNOSIS — J449 Chronic obstructive pulmonary disease, unspecified: Secondary | ICD-10-CM | POA: Insufficient documentation

## 2016-12-27 DIAGNOSIS — Z9011 Acquired absence of right breast and nipple: Secondary | ICD-10-CM | POA: Insufficient documentation

## 2016-12-27 DIAGNOSIS — Z8249 Family history of ischemic heart disease and other diseases of the circulatory system: Secondary | ICD-10-CM | POA: Insufficient documentation

## 2016-12-27 DIAGNOSIS — I959 Hypotension, unspecified: Secondary | ICD-10-CM | POA: Insufficient documentation

## 2016-12-27 DIAGNOSIS — Z79899 Other long term (current) drug therapy: Secondary | ICD-10-CM | POA: Insufficient documentation

## 2016-12-27 NOTE — Progress Notes (Signed)
Patient ID: ZILA LEAS, female    DOB: 1961-10-06, 56 y.o.   MRN: [id number]  HPI  Ms Larance is a 56 year old female with a history of morbid obesity, COPD, diabetes, HTN, pulmonary emboli and systolic heart failure who returns today for a f/u visit.   Last echo 03/14/15 with an EF of 45% without any stenosis or regurgitation.   Has not been admitted to the hospital in the last couple of years.   Returns today for f/u. Chronic fatigue. BP remains on the low side but no dizziness. Continues to have pain in both of her knees and has received cortisone injections with some relief. Does have some intermittent episodes where she feels more depressed than usual but says that usually occurs under stressful situations. Last saw her cardiologist on 01/24/16.   Past Medical History:  Diagnosis Date  . Anxiety   . Arthritis   . Asthma   . Breast cancer ([place]) [id number]   right   . CHF (congestive heart failure) ([place])   . COPD (chronic obstructive pulmonary disease) ([place])   . Depression   . Diabetes mellitus without complication ([place])   . GERD (gastroesophageal reflux disease)   . Hyperlipidemia   . Hypertension   . Pulmonary emboli [facility])    Past Surgical History:  Procedure Laterality Date  . ABDOMINAL HYSTERECTOMY    . BREAST BIOPSY Right 2006   positive  . BREAST EXCISIONAL BIOPSY Right 2003   positive, chemo and radiation  . MASTECTOMY Right 2006  . TUBAL LIGATION Bilateral    Family History  Problem Relation Age of Onset  . Cancer - Cervical Maternal Grandmother   . Heart disease Mother   . Heart failure Sister   . Breast cancer Cousin    Social History  Substance Use Topics  . Smoking status: Never Smoker  . Smokeless tobacco: Never Used  . Alcohol use No   Allergies  Allergen Reactions  . Bee Venom Swelling  . Sulfa Antibiotics Hives  . Vicodin [Hydrocodone-Acetaminophen] Nausea And Vomiting  . Latex Itching and Rash  . Penicillins Itching   Prior to Admission  medications   Medication Sig Start Date End Date Taking? Authorizing Provider  albuterol (PROVENTIL HFA;VENTOLIN HFA) 108 (90 BASE) MCG/ACT inhaler Inhale 2 puffs into the lungs every 6 (six) hours as needed for wheezing or shortness of breath.   Yes Historical Provider, MD  aspirin 81 MG tablet Take 81 mg by mouth daily.   Yes Historical Provider, MD  BIOTIN PO Take 500 mcg by mouth daily.   Yes Historical Provider, MD  Calcium Carb-Cholecalciferol 600-200 MG-UNIT TABS Take 1 tablet by mouth 2 (two) times daily.   Yes Historical Provider, MD  carvedilol (COREG) 12.5 MG tablet Take 1 tablet (12.5 mg total) by mouth 2 (two) times daily with a meal. 10/02/16  Yes Alisa Graff, FNP  citalopram (CELEXA) 20 MG tablet Take 20 mg by mouth daily.   Yes Historical Provider, MD  furosemide (LASIX) 20 MG tablet Take 20 mg by mouth daily.   Yes Historical Provider, MD  gabapentin (NEURONTIN) 300 MG capsule Take 300 mg by mouth at bedtime.   Yes Historical Provider, MD  glipiZIDE (GLUCOTROL XL) 10 MG 24 hr tablet Take 10 mg by mouth 2 (two) times daily.   Yes Historical Provider, MD  loratadine (CLARITIN) 10 MG tablet Take 10 mg by mouth daily.   Yes Historical Provider, MD  metFORMIN (GLUCOPHAGE) 500 MG tablet Take  500 mg by mouth 2 (two) times daily with a meal.    Yes Historical Provider, MD  Omega 3 1000 MG CAPS Take 1 capsule by mouth daily.   Yes Historical Provider, MD  omeprazole (PRILOSEC) 20 MG capsule Take 20 mg by mouth 2 (two) times daily before a meal.   Yes Historical Provider, MD  oxybutynin (DITROPAN) 5 MG tablet Take 5 mg by mouth 2 (two) times daily.   Yes Historical Provider, MD  sacubitril-valsartan (ENTRESTO) 24-26 MG Take 1 tablet twice daily 10/02/16  Yes Alisa Graff, FNP  simvastatin (ZOCOR) 40 MG tablet Take 40 mg by mouth daily.   Yes Historical Provider, MD  spironolactone (ALDACTONE) 25 MG tablet Take 25 mg by mouth daily.   Yes Historical Provider, MD  vitamin C (ASCORBIC  ACID) 500 MG tablet Take 500 mg by mouth daily.   Yes Historical Provider, MD     Review of Systems  Constitutional: Positive for fatigue. Negative for appetite change.  HENT: Negative for congestion, postnasal drip and sore throat.   Eyes: Negative.   Respiratory: Negative for chest tightness, shortness of breath and wheezing.   Cardiovascular: Negative for chest pain, palpitations and leg swelling.  Gastrointestinal: Negative for abdominal distention and abdominal pain.  Endocrine: Negative.   Genitourinary: Negative.   Musculoskeletal: Positive for arthralgias (both knees). Negative for back pain.  Skin: Negative.   Allergic/Immunologic: Negative.   Neurological: Negative for dizziness and light-headedness.  Hematological: Negative for adenopathy. Does not bruise/bleed easily.  Psychiatric/Behavioral: Positive for dysphoric mood and sleep disturbance (difficulty in staying asleep; sleeping on 1 pillow). Negative for suicidal ideas. The patient is not nervous/anxious.    Vitals:   12/27/16 0941  BP: (!) 104/50  Pulse: 87  Resp: 18  SpO2: 100%  Weight: 260 lb 6 oz (118.1 kg)  Height: 5\' 1"  (1.549 m)   Wt Readings from Last 3 Encounters:  12/27/16 260 lb 6 oz (118.1 kg)  08/24/16 259 lb (117.5 kg)  04/26/16 254 lb (115.2 kg)   Lab Results  Component Value Date   CREATININE 1.03 (H) 08/02/2015   CREATININE 1.05 07/27/2014   CREATININE 1.07 07/02/2014    Physical Exam  Constitutional: She is oriented to person, place, and time. She appears well-developed and well-nourished.  HENT:  Head: Normocephalic and atraumatic.  Eyes: Conjunctivae are normal. Pupils are equal, round, and reactive to light.  Neck: Normal range of motion. Neck supple. No JVD present.  Cardiovascular: Normal rate and regular rhythm.   Pulmonary/Chest: Effort normal. She has no wheezes. She has no rales.  Abdominal: Soft. She exhibits no distension. There is no tenderness.  Musculoskeletal: She  exhibits no edema or tenderness.  Neurological: She is alert and oriented to person, place, and time.  Skin: Skin is warm and dry.  Psychiatric: She has a normal mood and affect. Her behavior is normal. Thought content normal.  Nursing note and vitals reviewed.   Assessment & Plan  1: Chronic heart failure with reduced ejection fraction-  -Last echo done 03/14/15 with an EF of 45%. Need to order an echocardiogram if not done before she returns. -NYHA Class II today -Volume status stable  -Weight stable from last visit here. Encouraged increased ambulation or water aerobics since having knee pain. To call for overnight weight gain of >2 pounds or weekly weight gain of >5 pounds.  -Received flu vaccine for this year -Currently on entresto, carvedilol, furosemide and spironolactone.  - hasn't seen cardiologist since  01/24/16. Will need to schedule follow-up with Nehemiah Massed.   2: Hypotension- -BP remains low but without dizziness.  -Unable to titrate entresto or carvedilol due to hypotension  3: Diabetes without long-term insulin use- -Glucose today was 90  -Continues on glipizide and metformin.  -Follows closely with PCP about this.  4: Bilateral knee pain- - has received cortisone injections with some relief   - encouraged as much activity as possible with her knee pain  Medication bottles were reviewed.  Return here in 3 months or sooner for any questions/problems before then.

## 2016-12-27 NOTE — Patient Instructions (Signed)
Continue weighing daily and call for an overnight weight gain of > 2 pounds or a weekly weight gain of >5 pounds. 

## 2016-12-28 DIAGNOSIS — M25562 Pain in left knee: Secondary | ICD-10-CM

## 2016-12-28 DIAGNOSIS — M25561 Pain in right knee: Secondary | ICD-10-CM | POA: Insufficient documentation

## 2017-01-01 ENCOUNTER — Telehealth: Payer: Self-pay

## 2017-01-01 NOTE — Telephone Encounter (Signed)
Called to inform patient of appointment made with Dr. Nehemiah Massed scheduled on 2/12@ 3 pm. Unable to leave message as voicemail was full. Dr. Fabiola Backer office will also send reminder.

## 2017-03-27 ENCOUNTER — Ambulatory Visit: Payer: Medicare Other | Admitting: Family

## 2017-04-05 ENCOUNTER — Ambulatory Visit: Payer: Medicare Other | Attending: Family | Admitting: Family

## 2017-04-05 VITALS — BP 120/60 | HR 73 | Resp 20 | Ht 61.0 in | Wt 262.1 lb

## 2017-04-05 DIAGNOSIS — J449 Chronic obstructive pulmonary disease, unspecified: Secondary | ICD-10-CM | POA: Diagnosis not present

## 2017-04-05 DIAGNOSIS — I110 Hypertensive heart disease with heart failure: Secondary | ICD-10-CM | POA: Diagnosis not present

## 2017-04-05 DIAGNOSIS — I959 Hypotension, unspecified: Secondary | ICD-10-CM | POA: Insufficient documentation

## 2017-04-05 DIAGNOSIS — Z9011 Acquired absence of right breast and nipple: Secondary | ICD-10-CM | POA: Diagnosis not present

## 2017-04-05 DIAGNOSIS — M1990 Unspecified osteoarthritis, unspecified site: Secondary | ICD-10-CM | POA: Insufficient documentation

## 2017-04-05 DIAGNOSIS — Z6841 Body Mass Index (BMI) 40.0 and over, adult: Secondary | ICD-10-CM | POA: Diagnosis not present

## 2017-04-05 DIAGNOSIS — Z882 Allergy status to sulfonamides status: Secondary | ICD-10-CM | POA: Insufficient documentation

## 2017-04-05 DIAGNOSIS — E119 Type 2 diabetes mellitus without complications: Secondary | ICD-10-CM | POA: Insufficient documentation

## 2017-04-05 DIAGNOSIS — Z8249 Family history of ischemic heart disease and other diseases of the circulatory system: Secondary | ICD-10-CM | POA: Insufficient documentation

## 2017-04-05 DIAGNOSIS — Z7982 Long term (current) use of aspirin: Secondary | ICD-10-CM | POA: Diagnosis not present

## 2017-04-05 DIAGNOSIS — Z880 Allergy status to penicillin: Secondary | ICD-10-CM | POA: Diagnosis not present

## 2017-04-05 DIAGNOSIS — I5042 Chronic combined systolic (congestive) and diastolic (congestive) heart failure: Secondary | ICD-10-CM | POA: Insufficient documentation

## 2017-04-05 DIAGNOSIS — Z86711 Personal history of pulmonary embolism: Secondary | ICD-10-CM | POA: Diagnosis not present

## 2017-04-05 DIAGNOSIS — Z9889 Other specified postprocedural states: Secondary | ICD-10-CM | POA: Diagnosis not present

## 2017-04-05 DIAGNOSIS — Z853 Personal history of malignant neoplasm of breast: Secondary | ICD-10-CM | POA: Diagnosis not present

## 2017-04-05 DIAGNOSIS — Z90710 Acquired absence of both cervix and uterus: Secondary | ICD-10-CM | POA: Diagnosis not present

## 2017-04-05 DIAGNOSIS — Z7984 Long term (current) use of oral hypoglycemic drugs: Secondary | ICD-10-CM | POA: Diagnosis not present

## 2017-04-05 DIAGNOSIS — Z803 Family history of malignant neoplasm of breast: Secondary | ICD-10-CM | POA: Diagnosis not present

## 2017-04-05 DIAGNOSIS — Z91030 Bee allergy status: Secondary | ICD-10-CM | POA: Diagnosis not present

## 2017-04-05 DIAGNOSIS — Z885 Allergy status to narcotic agent status: Secondary | ICD-10-CM | POA: Insufficient documentation

## 2017-04-05 DIAGNOSIS — F329 Major depressive disorder, single episode, unspecified: Secondary | ICD-10-CM | POA: Diagnosis not present

## 2017-04-05 DIAGNOSIS — I5032 Chronic diastolic (congestive) heart failure: Secondary | ICD-10-CM

## 2017-04-05 DIAGNOSIS — Z91040 Latex allergy status: Secondary | ICD-10-CM | POA: Insufficient documentation

## 2017-04-05 DIAGNOSIS — E785 Hyperlipidemia, unspecified: Secondary | ICD-10-CM | POA: Diagnosis not present

## 2017-04-05 DIAGNOSIS — K219 Gastro-esophageal reflux disease without esophagitis: Secondary | ICD-10-CM | POA: Diagnosis not present

## 2017-04-05 DIAGNOSIS — I952 Hypotension due to drugs: Secondary | ICD-10-CM

## 2017-04-05 DIAGNOSIS — Z8049 Family history of malignant neoplasm of other genital organs: Secondary | ICD-10-CM | POA: Insufficient documentation

## 2017-04-05 NOTE — Patient Instructions (Signed)
Continue weighing daily and call for an overnight weight gain of > 2 pounds or a weekly weight gain of >5 pounds. 

## 2017-04-05 NOTE — Progress Notes (Signed)
Patient ID: Melissa Harrell, female    DOB: [birth year], 56 y.o.   MRN: [id number]  HPI  Melissa Harrell is a 56 year old female with a history of morbid obesity, COPD, diabetes, HTN, pulmonary emboli and systolic heart failure who returns today for a f/u visit.   Last echo done 02/08/17 which showed an EF of 50% along with mild MR/TR. Previous echo done 03/14/15 with an EF of 45% without any stenosis or regurgitation.   Has not been admitted to the hospital in the last couple of years.   Returns today for a follow-up visit with a chief complaint of chronic depression. She describes it as currently mild in nature with improvement due to medication and situation. Does not have any associated symptoms.   Past Medical History:  Diagnosis Date  . Anxiety   . Arthritis   . Asthma   . Breast cancer ([place]) [id number]   right   . CHF (congestive heart failure) ([place])   . COPD (chronic obstructive pulmonary disease) ([place])   . Depression   . Diabetes mellitus without complication ([place])   . GERD (gastroesophageal reflux disease)   . Hyperlipidemia   . Hypertension   . Pulmonary emboli [facility])    Past Surgical History:  Procedure Laterality Date  . ABDOMINAL HYSTERECTOMY    . BREAST BIOPSY Right 2006   positive  . BREAST EXCISIONAL BIOPSY Right 2003   positive, chemo and radiation  . MASTECTOMY Right 2006  . TUBAL LIGATION Bilateral    Family History  Problem Relation Age of Onset  . Cancer - Cervical Maternal Grandmother   . Heart disease Mother   . Heart failure Sister   . Breast cancer Cousin    Social History  Substance Use Topics  . Smoking status: Never Smoker  . Smokeless tobacco: Never Used  . Alcohol use No   Allergies  Allergen Reactions  . Bee Venom Swelling  . Sulfa Antibiotics Hives  . Vicodin [Hydrocodone-Acetaminophen] Nausea And Vomiting  . Latex Itching and Rash  . Penicillins Itching   Prior to Admission medications   Medication Sig Start Date End Date Taking?  Authorizing Provider  albuterol (PROVENTIL HFA;VENTOLIN HFA) 108 (90 BASE) MCG/ACT inhaler Inhale 2 puffs into the lungs every 6 (six) hours as needed for wheezing or shortness of breath.   Yes Historical Provider, MD  aspirin 81 MG tablet Take 81 mg by mouth daily.   Yes Historical Provider, MD  BIOTIN PO Take 500 mcg by mouth daily.   Yes Historical Provider, MD  Calcium Carb-Cholecalciferol 600-200 MG-UNIT TABS Take 1 tablet by mouth 2 (two) times daily.   Yes Historical Provider, MD  carvedilol (COREG) 12.5 MG tablet Take 1 tablet (12.5 mg total) by mouth 2 (two) times daily with a meal. 10/02/16  Yes Alisa Graff, FNP  citalopram (CELEXA) 20 MG tablet Take 20 mg by mouth daily.   Yes Historical Provider, MD  furosemide (LASIX) 20 MG tablet Take 20 mg by mouth daily.   Yes Historical Provider, MD  gabapentin (NEURONTIN) 300 MG capsule Take 300 mg by mouth at bedtime.   Yes Historical Provider, MD  glipiZIDE (GLUCOTROL XL) 10 MG 24 hr tablet Take 10 mg by mouth 2 (two) times daily.   Yes Historical Provider, MD  loratadine (CLARITIN) 10 MG tablet Take 10 mg by mouth daily.   Yes Historical Provider, MD  metFORMIN (GLUCOPHAGE) 500 MG tablet Take 500 mg by mouth 2 (two) times daily with  a meal.    Yes Historical Provider, MD  Omega 3 1000 MG CAPS Take 1 capsule by mouth daily.   Yes Historical Provider, MD  omeprazole (PRILOSEC) 20 MG capsule Take 20 mg by mouth 2 (two) times daily before a meal.   Yes Historical Provider, MD  oxybutynin (DITROPAN) 5 MG tablet Take 5 mg by mouth 2 (two) times daily.   Yes Historical Provider, MD  sacubitril-valsartan (ENTRESTO) 24-26 MG Take 1 tablet twice daily 10/02/16  Yes Alisa Graff, FNP  simvastatin (ZOCOR) 40 MG tablet Take 40 mg by mouth daily.   Yes Historical Provider, MD  spironolactone (ALDACTONE) 25 MG tablet Take 25 mg by mouth daily.   Yes Historical Provider, MD  vitamin C (ASCORBIC ACID) 500 MG tablet Take 500 mg by mouth daily.   Yes  Historical Provider, MD     Review of Systems  Constitutional: Negative for appetite change and fatigue.  HENT: Negative for congestion, postnasal drip and sore throat.   Eyes: Negative.   Respiratory: Negative for cough, chest tightness, shortness of breath and wheezing.   Cardiovascular: Negative for chest pain, palpitations and leg swelling.  Gastrointestinal: Negative for abdominal distention and abdominal pain.  Endocrine: Negative.   Genitourinary: Negative.   Musculoskeletal: Negative for arthralgias and back pain.  Skin: Negative.   Allergic/Immunologic: Negative.   Neurological: Negative for dizziness and light-headedness.  Hematological: Negative for adenopathy. Does not bruise/bleed easily.  Psychiatric/Behavioral: Positive for dysphoric mood. Negative for sleep disturbance (sleeping on 1 pillow) and suicidal ideas. The patient is not nervous/anxious.    Vitals:   04/05/17 1036  BP: 120/60  Pulse: 73  Resp: 20  SpO2: 100%  Weight: 262 lb 2 oz (118.9 kg)  Height: 5\' 1"  (1.549 m)   Wt Readings from Last 3 Encounters:  04/05/17 262 lb 2 oz (118.9 kg)  12/27/16 260 lb 6 oz (118.1 kg)  08/24/16 259 lb (117.5 kg)   Lab Results  Component Value Date   CREATININE 1.03 (H) 08/02/2015   CREATININE 1.05 07/27/2014   CREATININE 1.07 07/02/2014    Physical Exam  Constitutional: She is oriented to person, place, and time. She appears well-developed and well-nourished.  HENT:  Head: Normocephalic and atraumatic.  Neck: Normal range of motion. Neck supple. No JVD present.  Cardiovascular: Normal rate and regular rhythm.   Pulmonary/Chest: Effort normal. She has no wheezes. She has no rales.  Abdominal: Soft. She exhibits no distension. There is no tenderness.  Musculoskeletal: She exhibits no edema or tenderness.  Neurological: She is alert and oriented to person, place, and time.  Skin: Skin is warm and dry.  Psychiatric: She has a normal mood and affect. Her behavior  is normal. Thought content normal.  Nursing note and vitals reviewed.   Assessment & Plan  1: Chronic heart failure with preserved ejection fraction-  - echo done since she was last here and EF now 50% -NYHA Class I today -euvolemic today -Weight stable from last visit here. Encouraged increased ambulation or water aerobics.  - to call for an overnight weight gain of >2 pounds or weekly weight gain of >5 pounds.  -Currently on entresto, carvedilol, furosemide and spironolactone.  - not adding salt and trying to read food labels - saw cardiologist Nehemiah Massed) 03/06/17 and returns to him October 2018  2: Hypotension- -BP looks good today - she says that she has to call her PCP ([place]) and get an appointment scheduled  Medication bottles were reviewed.  Return here in 3 months or sooner for any questions/problems before then.

## 2017-04-07 ENCOUNTER — Encounter: Payer: Self-pay | Admitting: Family

## 2017-04-07 DIAGNOSIS — I5032 Chronic diastolic (congestive) heart failure: Secondary | ICD-10-CM | POA: Insufficient documentation

## 2017-06-12 ENCOUNTER — Other Ambulatory Visit: Payer: Self-pay | Admitting: Family Medicine

## 2017-06-12 DIAGNOSIS — Z1231 Encounter for screening mammogram for malignant neoplasm of breast: Secondary | ICD-10-CM

## 2017-06-19 ENCOUNTER — Ambulatory Visit: Payer: Medicare Other

## 2017-06-19 ENCOUNTER — Other Ambulatory Visit: Payer: Self-pay | Admitting: Family Medicine

## 2017-06-19 DIAGNOSIS — M79605 Pain in left leg: Secondary | ICD-10-CM

## 2017-06-25 ENCOUNTER — Ambulatory Visit
Admission: RE | Admit: 2017-06-25 | Discharge: 2017-06-25 | Disposition: A | Discharge status: Home / Self Care | Payer: Medicare Other | Source: Ambulatory Visit | Attending: Family Medicine | Admitting: Family Medicine

## 2017-06-25 DIAGNOSIS — M7122 Synovial cyst of popliteal space [Baker], left knee: Secondary | ICD-10-CM | POA: Insufficient documentation

## 2017-06-25 DIAGNOSIS — M79605 Pain in left leg: Secondary | ICD-10-CM | POA: Diagnosis present

## 2017-07-09 ENCOUNTER — Ambulatory Visit: Payer: Medicare Other | Attending: Family | Admitting: Family

## 2017-07-09 ENCOUNTER — Encounter: Payer: Self-pay | Admitting: Family

## 2017-07-09 VITALS — BP 128/75 | HR 82 | Resp 20 | Ht 61.0 in | Wt 263.1 lb

## 2017-07-09 DIAGNOSIS — F329 Major depressive disorder, single episode, unspecified: Secondary | ICD-10-CM | POA: Diagnosis not present

## 2017-07-09 DIAGNOSIS — Z885 Allergy status to narcotic agent status: Secondary | ICD-10-CM | POA: Insufficient documentation

## 2017-07-09 DIAGNOSIS — Z86711 Personal history of pulmonary embolism: Secondary | ICD-10-CM | POA: Diagnosis not present

## 2017-07-09 DIAGNOSIS — J449 Chronic obstructive pulmonary disease, unspecified: Secondary | ICD-10-CM | POA: Diagnosis not present

## 2017-07-09 DIAGNOSIS — I5042 Chronic combined systolic (congestive) and diastolic (congestive) heart failure: Secondary | ICD-10-CM | POA: Diagnosis present

## 2017-07-09 DIAGNOSIS — Z7982 Long term (current) use of aspirin: Secondary | ICD-10-CM | POA: Insufficient documentation

## 2017-07-09 DIAGNOSIS — K219 Gastro-esophageal reflux disease without esophagitis: Secondary | ICD-10-CM | POA: Diagnosis not present

## 2017-07-09 DIAGNOSIS — E119 Type 2 diabetes mellitus without complications: Secondary | ICD-10-CM | POA: Insufficient documentation

## 2017-07-09 DIAGNOSIS — Z7984 Long term (current) use of oral hypoglycemic drugs: Secondary | ICD-10-CM | POA: Insufficient documentation

## 2017-07-09 DIAGNOSIS — E785 Hyperlipidemia, unspecified: Secondary | ICD-10-CM | POA: Diagnosis not present

## 2017-07-09 DIAGNOSIS — I959 Hypotension, unspecified: Secondary | ICD-10-CM | POA: Insufficient documentation

## 2017-07-09 DIAGNOSIS — M62831 Muscle spasm of calf: Secondary | ICD-10-CM

## 2017-07-09 DIAGNOSIS — Z853 Personal history of malignant neoplasm of breast: Secondary | ICD-10-CM | POA: Diagnosis not present

## 2017-07-09 DIAGNOSIS — Z79899 Other long term (current) drug therapy: Secondary | ICD-10-CM | POA: Insufficient documentation

## 2017-07-09 DIAGNOSIS — F419 Anxiety disorder, unspecified: Secondary | ICD-10-CM | POA: Insufficient documentation

## 2017-07-09 DIAGNOSIS — M62838 Other muscle spasm: Secondary | ICD-10-CM | POA: Insufficient documentation

## 2017-07-09 DIAGNOSIS — I952 Hypotension due to drugs: Secondary | ICD-10-CM

## 2017-07-09 DIAGNOSIS — I110 Hypertensive heart disease with heart failure: Secondary | ICD-10-CM | POA: Insufficient documentation

## 2017-07-09 DIAGNOSIS — Z880 Allergy status to penicillin: Secondary | ICD-10-CM | POA: Diagnosis not present

## 2017-07-09 DIAGNOSIS — I5032 Chronic diastolic (congestive) heart failure: Secondary | ICD-10-CM

## 2017-07-09 NOTE — Progress Notes (Signed)
Patient ID: Melissa Harrell, female    DOB: 1961/07/19, 56 y.o.   MRN: [id number]  HPI  Ms Milbrath is a 56 year old female with a history of morbid obesity, COPD, diabetes, HTN, pulmonary emboli and systolic heart failure who returns today for a f/u visit.   Last echo done 02/08/17 which showed an EF of 50% along with mild MR/TR. Previous echo done 03/14/15 with an EF of 45% without any stenosis or regurgitation.   Has not been admitted to the hospital in the last couple of years.   Returns today for a follow-up visit with a chief complaint of left lower leg spasm. She says that this has been present for several months with varying levels of severity. She says that it wakes her up sometimes at night. Has been evaluated at her PCP's office regarding this.   Past Medical History:  Diagnosis Date  . Anxiety   . Arthritis   . Asthma   . Breast cancer ([place]) 1448,1856   right   . CHF (congestive heart failure) ([place])   . COPD (chronic obstructive pulmonary disease) ([place])   . Depression   . Diabetes mellitus without complication ([place])   . GERD (gastroesophageal reflux disease)   . Hyperlipidemia   . Hypertension   . Pulmonary emboli [facility])    Past Surgical History:  Procedure Laterality Date  . ABDOMINAL HYSTERECTOMY    . BREAST BIOPSY Right 2006   positive  . BREAST EXCISIONAL BIOPSY Right 2003   positive, chemo and radiation  . MASTECTOMY Right 2006  . TUBAL LIGATION Bilateral    Family History  Problem Relation Age of Onset  . Cancer - Cervical Maternal Grandmother   . Heart disease Mother   . Heart failure Sister   . Breast cancer Cousin    Social History  Substance Use Topics  . Smoking status: Never Smoker  . Smokeless tobacco: Never Used  . Alcohol use No   Allergies  Allergen Reactions  . Bee Venom Swelling  . Sulfa Antibiotics Hives  . Vicodin [Hydrocodone-Acetaminophen] Nausea And Vomiting  . Latex Itching and Rash  . Penicillins Itching   Prior to Admission  medications   Medication Sig Start Date End Date Taking? Authorizing Provider  albuterol (PROVENTIL HFA;VENTOLIN HFA) 108 (90 BASE) MCG/ACT inhaler Inhale 2 puffs into the lungs every 6 (six) hours as needed for wheezing or shortness of breath.   Yes [provider]  aspirin 81 MG tablet Take 81 mg by mouth daily.   Yes [provider]  BIOTIN PO Take 500 mcg by mouth daily.   Yes [provider]  Calcium Carb-Cholecalciferol 600-200 MG-UNIT TABS Take 1 tablet by mouth 2 (two) times daily.   Yes [provider]  carvedilol (COREG) 12.5 MG tablet Take 1 tablet (12.5 mg total) by mouth 2 (two) times daily with a meal. 10/02/16  Yes Hackney, Tina A, FNP  citalopram (CELEXA) 20 MG tablet Take 20 mg by mouth daily.   Yes [provider]  diclofenac (VOLTAREN) 75 MG EC tablet Take 75 mg by mouth daily. 06/27/17  Yes [provider]  furosemide (LASIX) 20 MG tablet Take 20 mg by mouth daily.   Yes [provider]  gabapentin (NEURONTIN) 300 MG capsule Take 300 mg by mouth at bedtime.   Yes [provider]  glipiZIDE (GLUCOTROL XL) 10 MG 24 hr tablet Take 10 mg by mouth 2 (two) times daily.   Yes [provider]  loratadine (CLARITIN) 10 MG tablet Take 10 mg by mouth daily.   Yes [provider]  metFORMIN (GLUCOPHAGE) 500 MG tablet Take 500 mg by mouth 2 (two) times daily with a meal.    Yes [provider]  Omega 3 1000 MG CAPS Take 1 capsule by mouth daily.   Yes [provider]  omeprazole (PRILOSEC) 20 MG capsule Take 20 mg by mouth 2 (two) times daily before a meal.   Yes [provider]  oxybutynin (DITROPAN) 5 MG tablet Take 5 mg by mouth 2 (two) times daily.   Yes [provider]  sacubitril-valsartan (ENTRESTO) 24-26 MG Take 1 tablet twice daily 10/02/16  Yes Darylene Price A, FNP  simvastatin (ZOCOR) 40 MG tablet Take 40 mg by mouth daily.   Yes [provider]   spironolactone (ALDACTONE) 25 MG tablet Take 25 mg by mouth daily.   Yes [provider]  vitamin C (ASCORBIC ACID) 500 MG tablet Take 500 mg by mouth daily.   Yes [provider]  meloxicam (MOBIC) 15 MG tablet Take 15 mg by mouth daily. 06/10/17   [provider]   Review of Systems  Constitutional: Negative for appetite change and fatigue.  HENT: Negative for congestion, postnasal drip and sore throat.   Eyes: Negative.   Respiratory: Negative for cough, chest tightness, shortness of breath and wheezing.   Cardiovascular: Positive for leg swelling (left ankle). Negative for chest pain and palpitations.  Gastrointestinal: Negative for abdominal distention and abdominal pain.  Endocrine: Negative.   Genitourinary: Negative.   Musculoskeletal: Negative for arthralgias and back pain.       Left lower leg cramp during the night   Skin: Negative.   Allergic/Immunologic: Negative.   Neurological: Negative for dizziness and light-headedness.  Hematological: Negative for adenopathy. Does not bruise/bleed easily.  Psychiatric/Behavioral: Positive for dysphoric mood. Negative for sleep disturbance (sleeping on 1 pillow) and suicidal ideas. The patient is not nervous/anxious.    Vitals:   07/09/17 1113  BP: 128/75  Pulse: 82  Resp: 20  SpO2: 100%  Weight: 263 lb 2 oz (119.4 kg)  Height: 5\' 1"  (1.549 m)   Wt Readings from Last 3 Encounters:  07/09/17 263 lb 2 oz (119.4 kg)  04/05/17 262 lb 2 oz (118.9 kg)  12/27/16 260 lb 6 oz (118.1 kg)    Lab Results  Component Value Date   CREATININE 1.03 (H) 08/02/2015   CREATININE 1.05 07/27/2014   CREATININE 1.07 07/02/2014    Physical Exam  Constitutional: She is oriented to person, place, and time. She appears well-developed and well-nourished.  HENT:  Head: Normocephalic and atraumatic.  Neck: Normal range of motion. Neck supple. No JVD present.  Cardiovascular: Normal rate and regular rhythm.    Pulmonary/Chest: Effort normal. She has no wheezes. She has no rales.  Abdominal: Soft. She exhibits no distension. There is no tenderness.  Musculoskeletal: She exhibits edema (1+ pitting edema around left ankle). She exhibits no tenderness.  Neurological: She is alert and oriented to person, place, and time.  Skin: Skin is warm and dry.  Psychiatric: She has a normal mood and affect. Her behavior is normal. Thought content normal.  Nursing note and vitals reviewed.   Assessment & Plan  1: Chronic heart failure with preserved ejection fraction-  -NYHA Class I today -euvolemic today -Weight stable from last visit here. Encouraged increased ambulation or water aerobics.  - to call for an overnight weight gain of >2 pounds or  weekly weight gain of >5 pounds.  -Currently on entresto, carvedilol, furosemide and spironolactone.  - not adding salt and trying to read food labels - saw cardiologist Nehemiah Massed) 03/06/17 and returns to him October 2018 - ReDS vest reading not obtained due to BMI  2: Hypotension- - BP looks good today - she is seeing her PCP Clide Deutscher) 07/17/17  3: Muscle spasm- - tends to occur every other night in the left lower leg - encouraged her to follow-up with her PCP regarding this.   Patient did not bring her medications nor a list. Each medication was verbally reviewed with the patient and she was encouraged to bring the bottles to every visit to confirm accuracy of list.  Return here in 3 months or sooner for any questions/problems before then.

## 2017-07-09 NOTE — Patient Instructions (Signed)
Continue weighing daily and call for an overnight weight gain of > 2 pounds or a weekly weight gain of >5 pounds. 

## 2017-07-10 DIAGNOSIS — M62831 Muscle spasm of calf: Secondary | ICD-10-CM | POA: Insufficient documentation

## 2017-07-29 ENCOUNTER — Other Ambulatory Visit: Payer: Self-pay | Admitting: Family Medicine

## 2017-07-29 ENCOUNTER — Ambulatory Visit
Admission: RE | Admit: 2017-07-29 | Discharge: 2017-07-29 | Disposition: A | Discharge status: Home / Self Care | Payer: Medicare Other | Source: Ambulatory Visit | Attending: Family Medicine | Admitting: Family Medicine

## 2017-07-29 DIAGNOSIS — Z1231 Encounter for screening mammogram for malignant neoplasm of breast: Secondary | ICD-10-CM

## 2017-07-29 HISTORY — DX: Personal history of antineoplastic chemotherapy: Z92.21

## 2017-07-29 HISTORY — DX: Personal history of irradiation: Z92.3

## 2017-10-09 ENCOUNTER — Telehealth: Payer: Self-pay | Admitting: Family

## 2017-10-09 ENCOUNTER — Ambulatory Visit: Payer: Medicare Other | Admitting: Family

## 2017-10-09 NOTE — Telephone Encounter (Signed)
Patient did not show for her [facility] appointment on 10/09/17. Will attempt to reschedule.

## 2017-10-15 ENCOUNTER — Other Ambulatory Visit: Payer: Self-pay | Admitting: Family

## 2017-10-18 ENCOUNTER — Encounter: Payer: Self-pay | Admitting: Family

## 2017-10-18 ENCOUNTER — Ambulatory Visit: Payer: Medicare Other | Attending: Family | Admitting: Family

## 2017-10-18 ENCOUNTER — Other Ambulatory Visit: Payer: Self-pay

## 2017-10-18 VITALS — BP 115/63 | HR 82 | Resp 18 | Ht 61.0 in | Wt 261.5 lb

## 2017-10-18 DIAGNOSIS — Z8249 Family history of ischemic heart disease and other diseases of the circulatory system: Secondary | ICD-10-CM | POA: Diagnosis not present

## 2017-10-18 DIAGNOSIS — Z885 Allergy status to narcotic agent status: Secondary | ICD-10-CM | POA: Insufficient documentation

## 2017-10-18 DIAGNOSIS — I959 Hypotension, unspecified: Secondary | ICD-10-CM | POA: Insufficient documentation

## 2017-10-18 DIAGNOSIS — E119 Type 2 diabetes mellitus without complications: Secondary | ICD-10-CM | POA: Diagnosis not present

## 2017-10-18 DIAGNOSIS — I110 Hypertensive heart disease with heart failure: Secondary | ICD-10-CM | POA: Insufficient documentation

## 2017-10-18 DIAGNOSIS — Z90710 Acquired absence of both cervix and uterus: Secondary | ICD-10-CM | POA: Diagnosis not present

## 2017-10-18 DIAGNOSIS — I952 Hypotension due to drugs: Secondary | ICD-10-CM

## 2017-10-18 DIAGNOSIS — Z880 Allergy status to penicillin: Secondary | ICD-10-CM | POA: Diagnosis not present

## 2017-10-18 DIAGNOSIS — I509 Heart failure, unspecified: Secondary | ICD-10-CM | POA: Insufficient documentation

## 2017-10-18 DIAGNOSIS — Z79899 Other long term (current) drug therapy: Secondary | ICD-10-CM | POA: Diagnosis not present

## 2017-10-18 DIAGNOSIS — Z9011 Acquired absence of right breast and nipple: Secondary | ICD-10-CM | POA: Diagnosis not present

## 2017-10-18 DIAGNOSIS — Z7982 Long term (current) use of aspirin: Secondary | ICD-10-CM | POA: Insufficient documentation

## 2017-10-18 DIAGNOSIS — Z803 Family history of malignant neoplasm of breast: Secondary | ICD-10-CM | POA: Diagnosis not present

## 2017-10-18 DIAGNOSIS — Z9851 Tubal ligation status: Secondary | ICD-10-CM | POA: Insufficient documentation

## 2017-10-18 DIAGNOSIS — I5032 Chronic diastolic (congestive) heart failure: Secondary | ICD-10-CM

## 2017-10-18 DIAGNOSIS — Z7984 Long term (current) use of oral hypoglycemic drugs: Secondary | ICD-10-CM | POA: Diagnosis not present

## 2017-10-18 NOTE — Progress Notes (Signed)
Patient ID: Melissa Harrell, female    DOB: 1961/08/29, 56 y.o.   MRN: [id number]  HPI  [name] is a 56 year old female with a history of morbid obesity, COPD, diabetes, HTN, pulmonary emboli and systolic heart failure who returns today for a f/u visit.   Last echo done 02/08/17 which showed an EF of 50% along with mild MR/TR. Previous echo done 03/14/15 with an EF of 45% without any stenosis or regurgitation.   Has not been admitted or in the ED in the last 6 months.   Returns today with a chief complaint of a follow-up visit. She denies any fatigue, chest pain, cough, shortness of breath, edema, palpitations, dizziness, difficulty sleeping or weight gain.    Past Medical History:  Diagnosis Date  . Anxiety   . Arthritis   . Asthma   . Breast cancer ([place]) 5465,0354   right   . CHF (congestive heart failure) ([place])   . COPD (chronic obstructive pulmonary disease) ([place])   . Depression   . Diabetes mellitus without complication ([place])   . GERD (gastroesophageal reflux disease)   . Hyperlipidemia   . Hypertension   . Personal history of chemotherapy   . Personal history of radiation therapy   . Pulmonary emboli [facility])    Past Surgical History:  Procedure Laterality Date  . ABDOMINAL HYSTERECTOMY    . BREAST BIOPSY Right 2006   positive  . BREAST EXCISIONAL BIOPSY Right 2003   positive, chemo and radiation  . MASTECTOMY Right 2006  . TUBAL LIGATION Bilateral    Family History  Problem Relation Age of Onset  . Cancer - Cervical Maternal Grandmother   . Heart disease Mother   . Heart failure Sister   . Breast cancer Cousin    Social History   Tobacco Use  . Smoking status: Never Smoker  . Smokeless tobacco: Never Used  Substance Use Topics  . Alcohol use: No    Alcohol/week: 0.0 oz   Allergies  Allergen Reactions  . Bee Venom Swelling  . Sulfa Antibiotics Hives  . Vicodin [Hydrocodone-Acetaminophen] Nausea And Vomiting  . Latex Itching and Rash  . Penicillins Itching    Prior to Admission medications   Medication Sig Start Date End Date Taking? Authorizing Provider  albuterol (PROVENTIL HFA;VENTOLIN HFA) 108 (90 BASE) MCG/ACT inhaler Inhale 2 puffs into the lungs every 6 (six) hours as needed for wheezing or shortness of breath.   Yes [provider]  aspirin 81 MG tablet Take 81 mg by mouth daily.   Yes [provider]  BIOTIN PO Take 500 mcg by mouth daily.   Yes [provider]  Calcium Carb-Cholecalciferol 600-200 MG-UNIT TABS Take 1 tablet by mouth 2 (two) times daily.   Yes [provider]  carvedilol (COREG) 12.5 MG tablet TAKE 1 TABLET BY MOUTH 2 TIMES DAILY WITH A MEAL 10/16/17  Yes Shalese Strahan A, FNP  citalopram (CELEXA) 20 MG tablet Take 20 mg by mouth daily.   Yes [provider]  diclofenac (VOLTAREN) 75 MG EC tablet Take 75 mg by mouth daily. 06/27/17  Yes [provider]  ENTRESTO 24-26 MG TAKE 1 TABLET BY MOUTH TWICE A DAY. 10/16/17  Yes Veera Stapleton, Otila Kluver A, FNP  exenatide (BYETTA) 5 MCG/0.02ML SOPN injection Inject 5 mcg 2 (two) times daily with a meal into the skin.   Yes [provider]  furosemide (LASIX) 20 MG tablet Take 20 mg by mouth daily.   Yes  [provider]  gabapentin (NEURONTIN) 300 MG capsule Take 300 mg by mouth at bedtime.   Yes [provider]  glipiZIDE (GLUCOTROL XL) 10 MG 24 hr tablet Take 10 mg by mouth 2 (two) times daily.   Yes [provider]  loratadine (CLARITIN) 10 MG tablet Take 10 mg by mouth daily.   Yes [provider]  magnesium oxide (MAG-OX) 400 MG tablet Take 400 mg daily by mouth.   Yes [provider]  Melatonin 5 MG TABS Take 5 mg at bedtime as needed by mouth.   Yes [provider]  meloxicam (MOBIC) 15 MG tablet Take 15 mg by mouth daily. 06/10/17  Yes [provider]  metFORMIN (GLUCOPHAGE) 500 MG tablet Take 500 mg by mouth 2 (two) times daily with a meal.    Yes [provider]  Multiple Vitamin (MULTIVITAMIN) capsule Take 1 capsule daily by mouth.   Yes [provider]  Omega 3 1000 MG CAPS Take 1 capsule by mouth daily.   Yes [provider]  omeprazole (PRILOSEC) 20 MG capsule Take 20 mg by mouth 2 (two) times daily before a meal.   Yes [provider]  oxybutynin (DITROPAN) 5 MG tablet Take 5 mg by mouth 2 (two) times daily.   Yes [provider]  simvastatin (ZOCOR) 40 MG tablet Take 40 mg by mouth daily.   Yes [provider]  spironolactone (ALDACTONE) 25 MG tablet Take 25 mg by mouth daily.   Yes [provider]  vitamin C (ASCORBIC ACID) 500 MG tablet Take 500 mg by mouth daily.   Yes [provider]    Review of Systems  Constitutional: Negative for appetite change and fatigue.  HENT: Negative for congestion, postnasal drip and sore throat.   Eyes: Negative.   Respiratory: Negative for cough, chest tightness, shortness of breath and wheezing.   Cardiovascular: Negative for chest pain, palpitations and leg swelling.  Gastrointestinal: Negative for abdominal distention and abdominal pain.  Endocrine: Negative.   Genitourinary: Negative.   Musculoskeletal: Negative for arthralgias and back pain.           Skin: Negative.   Allergic/Immunologic: Negative.   Neurological: Negative for dizziness and light-headedness.  Hematological: Negative for adenopathy. Does not bruise/bleed easily.  Psychiatric/Behavioral: Positive for dysphoric mood. Negative for sleep disturbance (sleeping on 1 pillow) and suicidal ideas. The patient is not nervous/anxious.    Vitals:   10/18/17 0930  BP: 115/63  Pulse: 82  Resp: 18  SpO2: 97%  Weight: 261 lb 8 oz (118.6 kg)  Height: 5\' 1"  (1.549 m)   Wt Readings from Last 3 Encounters:  10/18/17 261 lb 8 oz (118.6 kg)  07/09/17 263 lb 2 oz (119.4 kg)  04/05/17 262 lb 2 oz (118.9 kg)    Lab Results  Component Value Date   CREATININE 1.03  (H) 08/02/2015   CREATININE 1.05 07/27/2014   CREATININE 1.07 07/02/2014    Physical Exam  Constitutional: She is oriented to person, place, and time. She appears well-developed and well-nourished.  HENT:  Head: Normocephalic and atraumatic.  Neck: Normal range of motion. Neck supple. No JVD present.  Cardiovascular: Normal rate and regular rhythm.  Pulmonary/Chest: Effort normal. She has no wheezes. She has no rales.  Abdominal: Soft. She exhibits no distension. There is no tenderness.  Musculoskeletal: She exhibits no edema or tenderness.  Neurological: She is alert and oriented to person, place, and time.  Skin: Skin is warm  and dry.  Psychiatric: She has a normal mood and affect. Her behavior is normal. Thought content normal.  Nursing note and vitals reviewed.   Assessment & Plan  1: Chronic heart failure with preserved ejection fraction-  -NYHA Class I today -euvolemic today -Weight down 2 pounds from the last time she was here. - to call for an overnight weight gain of >2 pounds or weekly weight gain of >5 pounds.  - Currently on entresto, carvedilol, furosemide and spironolactone.  - not adding salt and trying to read food labels - saw cardiologist Nehemiah Massed) 03/06/17 - ReDS vest reading not obtained due to BMI - patient reports receiving her flu vaccine for this season - PharmD went in and reviewed medications with the patient  2: Hypotension- - BP looks good today - saw Clide Deutscher) October 2018 and returns next week  3: Diabetes- - reports being recently started on byetta - says that her glucose levels have been running in the 120's  Medication bottles were reviewed.   Return in 3 months or sooner for any questions/problems before then.

## 2017-10-18 NOTE — Patient Instructions (Signed)
Continue weighing daily and call for an overnight weight gain of > 2 pounds or a weekly weight gain of >5 pounds. 

## 2017-11-15 ENCOUNTER — Telehealth: Payer: Self-pay

## 2017-11-15 ENCOUNTER — Other Ambulatory Visit: Payer: Self-pay

## 2017-11-15 DIAGNOSIS — Z86010 Personal history of colonic polyps: Secondary | ICD-10-CM

## 2017-11-15 NOTE — Telephone Encounter (Signed)
Gastroenterology Pre-Procedure Review  Request Date:  Requesting Physician: Dr.   PATIENT REVIEW QUESTIONS: The patient responded to the following health history questions as indicated:    1. Are you having any GI issues? no 2. Do you have a personal history of Polyps? yes (repeat 5 years) 3. Do you have a family history of Colon Cancer or Polyps? no 4. Diabetes Mellitus? yes (Type 2) 5. Joint replacements in the past 12 months?no 6. Major health problems in the past 3 months?no 7. Any artificial heart valves, MVP, or defibrillator?no    MEDICATIONS & ALLERGIES:    Patient reports the following regarding taking any anticoagulation/antiplatelet therapy:   Plavix, Coumadin, Eliquis, Xarelto, Lovenox, Pradaxa, Brilinta, or Effient? no Aspirin? yes (ASA 81mg )  Patient confirms/reports the following medications:  Current Outpatient Medications  Medication Sig Dispense Refill  . albuterol (PROVENTIL HFA;VENTOLIN HFA) 108 (90 BASE) MCG/ACT inhaler Inhale 2 puffs into the lungs every 6 (six) hours as needed for wheezing or shortness of breath.    Marland Kitchen aspirin 81 MG tablet Take 81 mg by mouth daily.    Marland Kitchen BIOTIN PO Take 500 mcg by mouth daily.    . Calcium Carb-Cholecalciferol 600-200 MG-UNIT TABS Take 1 tablet by mouth 2 (two) times daily.    . carvedilol (COREG) 12.5 MG tablet TAKE 1 TABLET BY MOUTH 2 TIMES DAILY WITH A MEAL 180 tablet 3  . citalopram (CELEXA) 20 MG tablet Take 20 mg by mouth daily.    . diclofenac (VOLTAREN) 75 MG EC tablet Take 75 mg by mouth daily.    Marland Kitchen ENTRESTO 24-26 MG TAKE 1 TABLET BY MOUTH TWICE A DAY. 180 tablet 3  . exenatide (BYETTA) 5 MCG/0.02ML SOPN injection Inject 5 mcg 2 (two) times daily with a meal into the skin.    . furosemide (LASIX) 20 MG tablet Take 20 mg by mouth daily.    Marland Kitchen gabapentin (NEURONTIN) 300 MG capsule Take 300 mg by mouth at bedtime.    Marland Kitchen glipiZIDE (GLUCOTROL XL) 10 MG 24 hr tablet Take 10 mg by mouth 2 (two) times daily.    Marland Kitchen loratadine  (CLARITIN) 10 MG tablet Take 10 mg by mouth daily.    . magnesium oxide (MAG-OX) 400 MG tablet Take 400 mg daily by mouth.    . Melatonin 5 MG TABS Take 5 mg at bedtime as needed by mouth.    . meloxicam (MOBIC) 15 MG tablet Take 15 mg by mouth daily.    . metFORMIN (GLUCOPHAGE) 500 MG tablet Take 500 mg by mouth 2 (two) times daily with a meal.     . Multiple Vitamin (MULTIVITAMIN) capsule Take 1 capsule daily by mouth.    . Omega 3 1000 MG CAPS Take 1 capsule by mouth daily.    Marland Kitchen omeprazole (PRILOSEC) 20 MG capsule Take 20 mg by mouth 2 (two) times daily before a meal.    . oxybutynin (DITROPAN) 5 MG tablet Take 5 mg by mouth 2 (two) times daily.    . simvastatin (ZOCOR) 40 MG tablet Take 40 mg by mouth daily.    Marland Kitchen spironolactone (ALDACTONE) 25 MG tablet Take 25 mg by mouth daily.    . vitamin C (ASCORBIC ACID) 500 MG tablet Take 500 mg by mouth daily.     No current facility-administered medications for this visit.     Patient confirms/reports the following allergies:  Allergies  Allergen Reactions  . Bee Venom Swelling  . Sulfa Antibiotics Hives  . Vicodin [Hydrocodone-Acetaminophen] Nausea  And Vomiting  . Latex Itching and Rash  . Penicillins Itching    No orders of the defined types were placed in this encounter.   AUTHORIZATION INFORMATION Primary Insurance: 1D#: Group #:  Secondary Insurance: 1D#: Group #:  SCHEDULE INFORMATION: Date: 12/30/17 Time: Location: [place]

## 2017-12-30 ENCOUNTER — Encounter: Payer: Self-pay | Admitting: Anesthesiology

## 2017-12-30 ENCOUNTER — Ambulatory Visit: Payer: Medicare Other | Admitting: Anesthesiology

## 2017-12-30 ENCOUNTER — Encounter
Admission: RE | Disposition: A | Discharge status: Home / Self Care | Payer: Self-pay | Source: Ambulatory Visit | Attending: Gastroenterology

## 2017-12-30 ENCOUNTER — Ambulatory Visit
Admission: RE | Admit: 2017-12-30 | Discharge: 2017-12-30 | Disposition: A | Discharge status: Home / Self Care | Payer: Medicare Other | Source: Ambulatory Visit | Attending: Gastroenterology | Admitting: Gastroenterology

## 2017-12-30 DIAGNOSIS — K219 Gastro-esophageal reflux disease without esophagitis: Secondary | ICD-10-CM | POA: Diagnosis not present

## 2017-12-30 DIAGNOSIS — Z7982 Long term (current) use of aspirin: Secondary | ICD-10-CM | POA: Diagnosis not present

## 2017-12-30 DIAGNOSIS — Z86010 Personal history of colonic polyps: Secondary | ICD-10-CM | POA: Diagnosis not present

## 2017-12-30 DIAGNOSIS — Z91030 Bee allergy status: Secondary | ICD-10-CM | POA: Insufficient documentation

## 2017-12-30 DIAGNOSIS — I110 Hypertensive heart disease with heart failure: Secondary | ICD-10-CM | POA: Insufficient documentation

## 2017-12-30 DIAGNOSIS — Z7984 Long term (current) use of oral hypoglycemic drugs: Secondary | ICD-10-CM | POA: Diagnosis not present

## 2017-12-30 DIAGNOSIS — Z885 Allergy status to narcotic agent status: Secondary | ICD-10-CM | POA: Diagnosis not present

## 2017-12-30 DIAGNOSIS — Z880 Allergy status to penicillin: Secondary | ICD-10-CM | POA: Insufficient documentation

## 2017-12-30 DIAGNOSIS — Z9221 Personal history of antineoplastic chemotherapy: Secondary | ICD-10-CM | POA: Diagnosis not present

## 2017-12-30 DIAGNOSIS — M1990 Unspecified osteoarthritis, unspecified site: Secondary | ICD-10-CM | POA: Insufficient documentation

## 2017-12-30 DIAGNOSIS — F419 Anxiety disorder, unspecified: Secondary | ICD-10-CM | POA: Insufficient documentation

## 2017-12-30 DIAGNOSIS — E785 Hyperlipidemia, unspecified: Secondary | ICD-10-CM | POA: Insufficient documentation

## 2017-12-30 DIAGNOSIS — I509 Heart failure, unspecified: Secondary | ICD-10-CM | POA: Insufficient documentation

## 2017-12-30 DIAGNOSIS — Z86711 Personal history of pulmonary embolism: Secondary | ICD-10-CM | POA: Insufficient documentation

## 2017-12-30 DIAGNOSIS — F329 Major depressive disorder, single episode, unspecified: Secondary | ICD-10-CM | POA: Diagnosis not present

## 2017-12-30 DIAGNOSIS — Z90710 Acquired absence of both cervix and uterus: Secondary | ICD-10-CM | POA: Insufficient documentation

## 2017-12-30 DIAGNOSIS — D123 Benign neoplasm of transverse colon: Secondary | ICD-10-CM | POA: Insufficient documentation

## 2017-12-30 DIAGNOSIS — Z91040 Latex allergy status: Secondary | ICD-10-CM | POA: Insufficient documentation

## 2017-12-30 DIAGNOSIS — Z853 Personal history of malignant neoplasm of breast: Secondary | ICD-10-CM | POA: Diagnosis not present

## 2017-12-30 DIAGNOSIS — Z7951 Long term (current) use of inhaled steroids: Secondary | ICD-10-CM | POA: Insufficient documentation

## 2017-12-30 DIAGNOSIS — E119 Type 2 diabetes mellitus without complications: Secondary | ICD-10-CM | POA: Diagnosis not present

## 2017-12-30 DIAGNOSIS — Z79899 Other long term (current) drug therapy: Secondary | ICD-10-CM | POA: Diagnosis not present

## 2017-12-30 DIAGNOSIS — D124 Benign neoplasm of descending colon: Secondary | ICD-10-CM | POA: Diagnosis not present

## 2017-12-30 DIAGNOSIS — Z1211 Encounter for screening for malignant neoplasm of colon: Secondary | ICD-10-CM | POA: Diagnosis present

## 2017-12-30 DIAGNOSIS — Z882 Allergy status to sulfonamides status: Secondary | ICD-10-CM | POA: Insufficient documentation

## 2017-12-30 DIAGNOSIS — Z923 Personal history of irradiation: Secondary | ICD-10-CM | POA: Diagnosis not present

## 2017-12-30 DIAGNOSIS — K5730 Diverticulosis of large intestine without perforation or abscess without bleeding: Secondary | ICD-10-CM | POA: Diagnosis not present

## 2017-12-30 DIAGNOSIS — Z803 Family history of malignant neoplasm of breast: Secondary | ICD-10-CM | POA: Insufficient documentation

## 2017-12-30 DIAGNOSIS — J449 Chronic obstructive pulmonary disease, unspecified: Secondary | ICD-10-CM | POA: Diagnosis not present

## 2017-12-30 DIAGNOSIS — Z8049 Family history of malignant neoplasm of other genital organs: Secondary | ICD-10-CM | POA: Insufficient documentation

## 2017-12-30 DIAGNOSIS — Z8249 Family history of ischemic heart disease and other diseases of the circulatory system: Secondary | ICD-10-CM | POA: Insufficient documentation

## 2017-12-30 HISTORY — PX: COLONOSCOPY WITH PROPOFOL: SHX5780

## 2017-12-30 LAB — GLUCOSE, CAPILLARY: GLUCOSE-CAPILLARY: 84 mg/dL (ref 65–99)

## 2017-12-30 SURGERY — COLONOSCOPY WITH PROPOFOL
Anesthesia: General

## 2017-12-30 MED ORDER — PROPOFOL 500 MG/50ML IV EMUL
INTRAVENOUS | Status: AC
  Filled 2017-12-30: qty 50

## 2017-12-30 MED ORDER — PROPOFOL 10 MG/ML IV BOLUS
INTRAVENOUS | Status: DC | PRN
  Administered 2017-12-30: 100 mg via INTRAVENOUS

## 2017-12-30 MED ORDER — SODIUM CHLORIDE 0.9 % IV SOLN
INTRAVENOUS | Status: DC
  Administered 2017-12-30: 1000 mL via INTRAVENOUS

## 2017-12-30 MED ORDER — PROPOFOL 500 MG/50ML IV EMUL
INTRAVENOUS | Status: DC | PRN
  Administered 2017-12-30: 150 ug/kg/min via INTRAVENOUS

## 2017-12-30 MED ORDER — LIDOCAINE HCL (PF) 1 % IJ SOLN
INTRAMUSCULAR | Status: AC
  Administered 2017-12-30: 0.3 mL
  Filled 2017-12-30: qty 2

## 2017-12-30 NOTE — H&P (Signed)
Jonathon Bellows, MD [address], [place], [place], [place], [address] [address], [place], [place], [place], [address] Phone: [phone]  Fax: [phone]  Primary Care Physician:  Donnie Coffin, MD   Pre-Procedure History & Physical: HPI:  Melissa Harrell is a 57 y.o. female is here for an colonoscopy.   Past Medical History:  Diagnosis Date  . Anxiety   . Arthritis   . Asthma   . Breast cancer ([place]) [id number]   right   . CHF (congestive heart failure) ([place])   . COPD (chronic obstructive pulmonary disease) ([place])   . Depression   . Diabetes mellitus without complication ([place])   . GERD (gastroesophageal reflux disease)   . Hyperlipidemia   . Hypertension   . Personal history of chemotherapy   . Personal history of radiation therapy   . Pulmonary emboli [facility])     Past Surgical History:  Procedure Laterality Date  . ABDOMINAL HYSTERECTOMY    . BREAST BIOPSY Right 2006   positive  . BREAST EXCISIONAL BIOPSY Right 2003   positive, chemo and radiation  . MASTECTOMY Right 2006  . TUBAL LIGATION Bilateral     Prior to Admission medications   Medication Sig Start Date End Date Taking? Authorizing Provider  albuterol (PROVENTIL HFA;VENTOLIN HFA) 108 (90 BASE) MCG/ACT inhaler Inhale 2 puffs into the lungs every 6 (six) hours as needed for wheezing or shortness of breath.    [provider]  aspirin 81 MG tablet Take 81 mg by mouth daily.    [provider]  BIOTIN PO Take 500 mcg by mouth daily.    [provider]  Calcium Carb-Cholecalciferol 600-200 MG-UNIT TABS Take 1 tablet by mouth 2 (two) times daily.    [provider]  carvedilol (COREG) 12.5 MG tablet TAKE 1 TABLET BY MOUTH 2 TIMES DAILY WITH A MEAL 10/16/17   Darylene Price A, FNP  citalopram (CELEXA) 20 MG tablet Take 20 mg by mouth daily.    [provider]  diclofenac (VOLTAREN) 75 MG EC tablet Take 75 mg by mouth daily. 06/27/17   [provider]  ENTRESTO  24-26 MG TAKE 1 TABLET BY MOUTH TWICE A DAY. 10/16/17   Alisa Graff, FNP  exenatide (BYETTA) 5 MCG/0.02ML SOPN injection Inject 5 mcg 2 (two) times daily with a meal into the skin.    [provider]  furosemide (LASIX) 20 MG tablet Take 20 mg by mouth daily.    [provider]  gabapentin (NEURONTIN) 300 MG capsule Take 300 mg by mouth at bedtime.    [provider]  glipiZIDE (GLUCOTROL XL) 10 MG 24 hr tablet Take 10 mg by mouth 2 (two) times daily.    [provider]  loratadine (CLARITIN) 10 MG tablet Take 10 mg by mouth daily.    [provider]  magnesium oxide (MAG-OX) 400 MG tablet Take 400 mg daily by mouth.    [provider]  Melatonin 5 MG TABS Take 5 mg at bedtime as needed by mouth.    [provider]  meloxicam (MOBIC) 15 MG tablet Take 15 mg by mouth daily. 06/10/17   [provider]  metFORMIN (GLUCOPHAGE) 500 MG tablet Take 500 mg by mouth 2 (two) times daily with a meal.     [provider]  Multiple Vitamin (MULTIVITAMIN) capsule Take 1 capsule daily by mouth.    [provider]  Omega 3 1000 MG CAPS Take 1 capsule by mouth  daily.    [provider]  omeprazole (PRILOSEC) 20 MG capsule Take 20 mg by mouth 2 (two) times daily before a meal.    [provider]  oxybutynin (DITROPAN) 5 MG tablet Take 5 mg by mouth 2 (two) times daily.    [provider]  simvastatin (ZOCOR) 40 MG tablet Take 40 mg by mouth daily.    [provider]  spironolactone (ALDACTONE) 25 MG tablet Take 25 mg by mouth daily.    [provider]  vitamin C (ASCORBIC ACID) 500 MG tablet Take 500 mg by mouth daily.    [provider]    Allergies as of 11/15/2017 - Review Complete 10/18/2017  Allergen Reaction Noted  . Bee venom Swelling 03/25/2015  . Sulfa antibiotics Hives 03/25/2015  . Vicodin [hydrocodone-acetaminophen] Nausea And Vomiting 03/25/2015  . Latex  Itching and Rash 03/25/2015  . Penicillins Itching 03/25/2015    Family History  Problem Relation Age of Onset  . Cancer - Cervical Maternal Grandmother   . Heart disease Mother   . Heart failure Sister   . Breast cancer Cousin     Social History   Socioeconomic History  . Marital status: Divorced    Spouse name: Not on file  . Number of children: Not on file  . Years of education: Not on file  . Highest education level: Not on file  Social Needs  . Financial resource strain: Not on file  . Food insecurity - worry: Not on file  . Food insecurity - inability: Not on file  . Transportation needs - medical: Not on file  . Transportation needs - non-medical: Not on file  Occupational History  . Not on file  Tobacco Use  . Smoking status: Never Smoker  . Smokeless tobacco: Never Used  Substance and Sexual Activity  . Alcohol use: No    Alcohol/week: 0.0 oz  . Drug use: No  . Sexual activity: Not on file  Other Topics Concern  . Not on file  Social History Narrative  . Not on file    Review of Systems: See HPI, otherwise negative ROS  Physical Exam: BP (!) 144/75   Pulse 90   Temp (!) 96.4 F (35.8 C) (Tympanic)   Resp 17   Ht 5\' 1"  (1.549 m)   Wt 248 lb (112.5 kg)   LMP  (LMP Unknown)   SpO2 100%   BMI 46.86 kg/m  General:   Alert,  pleasant and cooperative in NAD Head:  Normocephalic and atraumatic. Neck:  Supple; no masses or thyromegaly. Lungs:  Clear throughout to auscultation, normal respiratory effort.    Heart:  +S1, +S2, Regular rate and rhythm, No edema. Abdomen:  Soft, nontender and nondistended. Normal bowel sounds, without guarding, and without rebound.   Neurologic:  Alert and  oriented x4;  grossly normal neurologically.  Impression/Plan: Melissa Harrell is here for an colonoscopy to be performed for surveillance due to prior history of colon polyps   Risks, benefits, limitations, and alternatives regarding  colonoscopy have been reviewed with  the patient.  Questions have been answered.  All parties agreeable.   Jonathon Bellows, MD  12/30/2017, 8:31 AM

## 2017-12-30 NOTE — Anesthesia Procedure Notes (Signed)
Date/Time: 12/30/2017 8:42 AM Performed by: Nelda Marseille, CRNA Pre-anesthesia Checklist: Patient identified, Emergency Drugs available, Suction available, Patient being monitored and Timeout performed Oxygen Delivery Method: Nasal cannula

## 2017-12-30 NOTE — Anesthesia Post-op Follow-up Note (Signed)
Anesthesia QCDR form completed.        

## 2017-12-30 NOTE — Op Note (Signed)
[facility] Gastroenterology Patient Name: Melissa Harrell Procedure Date: 12/30/2017 8:32 AM MRN: [id number] Account #: [id number] Date of Birth: 12/31/1960 Admit Type: Outpatient Age: 57 Room: [facility] ENDO ROOM 4 Gender: Female Note Status: Finalized Procedure:            Colonoscopy Indications:          High risk colon cancer surveillance: Personal history                        of colonic polyps Providers:            Jonathon Bellows MD, MD Referring MD:         Edmonia Lynch. Aycock MD (Referring MD) Medicines:            Monitored Anesthesia Care Complications:        No immediate complications. Procedure:            Pre-Anesthesia Assessment:                       - Prior to the procedure, a History and Physical was                        performed, and patient medications, allergies and                        sensitivities were reviewed. The patient's tolerance of                        previous anesthesia was reviewed.                       - The risks and benefits of the procedure and the                        sedation options and risks were discussed with the                        patient. All questions were answered and informed                        consent was obtained.                       - ASA Grade Assessment: III - A patient with severe                        systemic disease.                       After obtaining informed consent, the colonoscope was                        passed under direct vision. Throughout the procedure,                        the patient's blood pressure, pulse, and oxygen                        saturations were monitored continuously. The  Colonoscope was introduced through the anus and                        advanced to the the cecum, identified by the                        appendiceal orifice, IC valve and transillumination.                        The colonoscopy was performed with ease. The patient               tolerated the procedure well. The quality of the bowel                        preparation was adequate. Findings:      The perianal and digital rectal examinations were normal.      Three sessile polyps were found in the transverse colon. The polyps were       3 to 4 mm in size. These polyps were removed with a cold biopsy forceps.       Resection and retrieval were complete.      A 3 mm polyp was found in the descending colon. The polyp was sessile.       The polyp was removed with a cold biopsy forceps. Resection and       retrieval were complete.      Multiple small-mouthed diverticula were found in the entire colon.      No additional abnormalities were found on retroflexion. Impression:           - Three 3 to 4 mm polyps in the transverse colon,                        removed with a cold biopsy forceps. Resected and                        retrieved.                       - One 3 mm polyp in the descending colon, removed with                        a cold biopsy forceps. Resected and retrieved.                       - Diverticulosis in the entire examined colon. Recommendation:       - Discharge patient to home (with escort).                       - Resume previous diet.                       - Continue present medications.                       - Await pathology results.                       - Repeat colonoscopy in 3 - 5 years for surveillance. Procedure Code(s):    --- Professional ---  45380, Colonoscopy, flexible; with biopsy, single or                        multiple Diagnosis Code(s):    --- Professional ---                       Z86.010, Personal history of colonic polyps                       D12.3, Benign neoplasm of transverse colon (hepatic                        flexure or splenic flexure)                       D12.4, Benign neoplasm of descending colon                       K57.30, Diverticulosis of large intestine without                         perforation or abscess without bleeding CPT copyright 2016 American Medical Association. All rights reserved. The codes documented in this report are preliminary and upon coder review may  be revised to meet current compliance requirements. Jonathon Bellows, MD Jonathon Bellows MD, MD 12/30/2017 9:00:10 AM This report has been signed electronically. Number of Addenda: 0 Note Initiated On: 12/30/2017 8:32 AM Scope Withdrawal Time: 0 hours 16 minutes 47 seconds  Total Procedure Duration: 0 hours 18 minutes 17 seconds       [facility]

## 2017-12-30 NOTE — Transfer of Care (Signed)
Immediate Anesthesia Transfer of Care Note  Patient: Melissa Harrell  Procedure(s) Performed: COLONOSCOPY WITH PROPOFOL (N/A )  Patient Location: PACU  Anesthesia Type:General  Level of Consciousness: sedated  Airway & Oxygen Therapy: Patient Spontanous Breathing and Patient connected to nasal cannula oxygen  Post-op Assessment: Report given to RN and Post -op Vital signs reviewed and stable  Post vital signs: Reviewed and stable  Last Vitals:  Vitals:   12/30/17 0809  BP: (!) 144/75  Pulse: 90  Resp: 17  Temp: (!) 35.8 C  SpO2: 100%    Last Pain:  Vitals:   12/30/17 0809  TempSrc: Tympanic         Complications: No apparent anesthesia complications

## 2017-12-30 NOTE — Anesthesia Preprocedure Evaluation (Addendum)
Anesthesia Evaluation  Patient identified by MRN, date of birth, ID band Patient awake    Reviewed: Allergy & Precautions, NPO status , Patient's Chart, lab work & pertinent test results, reviewed documented beta blocker date and time   Airway Mallampati: III  TM Distance: >3 FB     Dental  (+) Chipped, Missing, Poor Dentition   Pulmonary asthma , pneumonia, COPD,           Cardiovascular hypertension, Pt. on medications and Pt. on home beta blockers      Neuro/Psych PSYCHIATRIC DISORDERS Anxiety Depression  Neuromuscular disease    GI/Hepatic GERD  ,  Endo/Other  diabetes, Type 2Morbid obesity  Renal/GU      Musculoskeletal  (+) Arthritis ,   Abdominal   Peds  Hematology   Anesthesia Other Findings   Reproductive/Obstetrics                            Anesthesia Physical Anesthesia Plan  ASA: III  Anesthesia Plan: General   Post-op Pain Management:    Induction: Intravenous  PONV Risk Score and Plan:   Airway Management Planned:   Additional Equipment:   Intra-op Plan:   Post-operative Plan:   Informed Consent: I have reviewed the patients History and Physical, chart, labs and discussed the procedure including the risks, benefits and alternatives for the proposed anesthesia with the patient or authorized representative who has indicated his/her understanding and acceptance.     Plan Discussed with: CRNA  Anesthesia Plan Comments:         Anesthesia Quick Evaluation

## 2017-12-30 NOTE — Anesthesia Postprocedure Evaluation (Signed)
Anesthesia Post Note  Patient: Melissa Harrell  Procedure(s) Performed: COLONOSCOPY WITH PROPOFOL (N/A )  Patient location during evaluation: Endoscopy Anesthesia Type: General Level of consciousness: awake and alert Pain management: pain level controlled Vital Signs Assessment: post-procedure vital signs reviewed and stable Respiratory status: spontaneous breathing, nonlabored ventilation, respiratory function stable and patient connected to nasal cannula oxygen Cardiovascular status: blood pressure returned to baseline and stable Postop Assessment: no apparent nausea or vomiting Anesthetic complications: no     Last Vitals:  Vitals:   12/30/17 0931 12/30/17 0941  BP: (!) 198/108 (!) 182/81  Pulse: 75 69  Resp: (!) 21 17  Temp:    SpO2: 100% 100%    Last Pain:  Vitals:   12/30/17 0901  TempSrc: Tympanic                 [name] S

## 2017-12-31 ENCOUNTER — Encounter: Payer: Self-pay | Admitting: Gastroenterology

## 2017-12-31 LAB — SURGICAL PATHOLOGY

## 2018-01-02 ENCOUNTER — Encounter: Payer: Self-pay | Admitting: Gastroenterology

## 2018-01-17 ENCOUNTER — Ambulatory Visit: Payer: Medicare Other | Admitting: Family

## 2018-01-17 ENCOUNTER — Telehealth: Payer: Self-pay | Admitting: Family

## 2018-01-17 NOTE — Telephone Encounter (Signed)
Patient did not show for her Heart Failure Clinic appointment on 01/17/18. Will attempt to reschedule.

## 2018-06-23 ENCOUNTER — Other Ambulatory Visit: Payer: Self-pay | Admitting: Family Medicine

## 2018-06-23 DIAGNOSIS — Z1231 Encounter for screening mammogram for malignant neoplasm of breast: Secondary | ICD-10-CM

## 2018-07-30 ENCOUNTER — Ambulatory Visit
Admission: RE | Admit: 2018-07-30 | Discharge: 2018-07-30 | Disposition: A | Discharge status: Home / Self Care | Payer: Medicare Other | Source: Ambulatory Visit | Attending: Family Medicine | Admitting: Family Medicine

## 2018-07-30 DIAGNOSIS — Z1231 Encounter for screening mammogram for malignant neoplasm of breast: Secondary | ICD-10-CM | POA: Diagnosis present

## 2018-09-29 ENCOUNTER — Encounter: Payer: Self-pay | Admitting: Family

## 2018-09-29 ENCOUNTER — Ambulatory Visit: Payer: Medicare Other | Attending: Family | Admitting: Family

## 2018-09-29 VITALS — BP 92/46 | HR 75 | Resp 18 | Ht 61.0 in | Wt 258.1 lb

## 2018-09-29 DIAGNOSIS — I5042 Chronic combined systolic (congestive) and diastolic (congestive) heart failure: Secondary | ICD-10-CM | POA: Diagnosis present

## 2018-09-29 DIAGNOSIS — I952 Hypotension due to drugs: Secondary | ICD-10-CM

## 2018-09-29 DIAGNOSIS — Z880 Allergy status to penicillin: Secondary | ICD-10-CM | POA: Insufficient documentation

## 2018-09-29 DIAGNOSIS — Z8249 Family history of ischemic heart disease and other diseases of the circulatory system: Secondary | ICD-10-CM | POA: Insufficient documentation

## 2018-09-29 DIAGNOSIS — Z9010 Acquired absence of unspecified breast and nipple: Secondary | ICD-10-CM | POA: Insufficient documentation

## 2018-09-29 DIAGNOSIS — Z853 Personal history of malignant neoplasm of breast: Secondary | ICD-10-CM | POA: Insufficient documentation

## 2018-09-29 DIAGNOSIS — Z9889 Other specified postprocedural states: Secondary | ICD-10-CM | POA: Diagnosis not present

## 2018-09-29 DIAGNOSIS — F419 Anxiety disorder, unspecified: Secondary | ICD-10-CM | POA: Diagnosis not present

## 2018-09-29 DIAGNOSIS — M1990 Unspecified osteoarthritis, unspecified site: Secondary | ICD-10-CM | POA: Diagnosis not present

## 2018-09-29 DIAGNOSIS — Z86711 Personal history of pulmonary embolism: Secondary | ICD-10-CM | POA: Insufficient documentation

## 2018-09-29 DIAGNOSIS — J449 Chronic obstructive pulmonary disease, unspecified: Secondary | ICD-10-CM | POA: Insufficient documentation

## 2018-09-29 DIAGNOSIS — Z7982 Long term (current) use of aspirin: Secondary | ICD-10-CM | POA: Diagnosis not present

## 2018-09-29 DIAGNOSIS — E785 Hyperlipidemia, unspecified: Secondary | ICD-10-CM | POA: Diagnosis not present

## 2018-09-29 DIAGNOSIS — Z885 Allergy status to narcotic agent status: Secondary | ICD-10-CM | POA: Insufficient documentation

## 2018-09-29 DIAGNOSIS — Z882 Allergy status to sulfonamides status: Secondary | ICD-10-CM | POA: Diagnosis not present

## 2018-09-29 DIAGNOSIS — E119 Type 2 diabetes mellitus without complications: Secondary | ICD-10-CM | POA: Insufficient documentation

## 2018-09-29 DIAGNOSIS — K219 Gastro-esophageal reflux disease without esophagitis: Secondary | ICD-10-CM | POA: Insufficient documentation

## 2018-09-29 DIAGNOSIS — I5032 Chronic diastolic (congestive) heart failure: Secondary | ICD-10-CM

## 2018-09-29 DIAGNOSIS — Z7984 Long term (current) use of oral hypoglycemic drugs: Secondary | ICD-10-CM | POA: Diagnosis not present

## 2018-09-29 DIAGNOSIS — F329 Major depressive disorder, single episode, unspecified: Secondary | ICD-10-CM | POA: Insufficient documentation

## 2018-09-29 DIAGNOSIS — I110 Hypertensive heart disease with heart failure: Secondary | ICD-10-CM | POA: Diagnosis not present

## 2018-09-29 DIAGNOSIS — Z91040 Latex allergy status: Secondary | ICD-10-CM | POA: Diagnosis not present

## 2018-09-29 DIAGNOSIS — Z91030 Bee allergy status: Secondary | ICD-10-CM | POA: Insufficient documentation

## 2018-09-29 DIAGNOSIS — Z79899 Other long term (current) drug therapy: Secondary | ICD-10-CM | POA: Diagnosis not present

## 2018-09-29 NOTE — Patient Instructions (Signed)
Continue weighing daily and call for an overnight weight gain of > 2 pounds or a weekly weight gain of >5 pounds. 

## 2018-09-29 NOTE — Progress Notes (Signed)
Patient ID: Melissa Harrell, female    DOB: 11/18/1961, 57 y.o.   MRN: [id number]  HPI  Melissa Harrell is a 57 year old female with a history of morbid obesity, COPD, diabetes, HTN, pulmonary emboli and systolic heart failure who returns today for a f/u visit.   Last echo done 02/08/17 which showed an EF of 50% along with mild MR/TR. Previous echo done 03/14/15 with an EF of 45% without any stenosis or regurgitation.   Has not been admitted or in the ED in the last 6 months.   Returns today with a chief complaint of a follow-up visit. She currently endorses some depression as her nephew was shot and killed about a month ago. She has no associated symptoms and currently denies any difficulty sleeping, abdominal distention palpitations, pedal edema, chest pain, shortness of breath, cough, fatigue, dizziness or weight gain.   Past Medical History:  Diagnosis Date  . Anxiety   . Arthritis   . Asthma   . Breast cancer ([place]) [id number]   right   . CHF (congestive heart failure) ([place])   . COPD (chronic obstructive pulmonary disease) ([place])   . Depression   . Diabetes mellitus without complication ([place])   . GERD (gastroesophageal reflux disease)   . Hyperlipidemia   . Hypertension   . Personal history of chemotherapy   . Personal history of radiation therapy   . Pulmonary emboli [facility])    Past Surgical History:  Procedure Laterality Date  . ABDOMINAL HYSTERECTOMY    . BREAST BIOPSY Right 2006   positive  . BREAST EXCISIONAL BIOPSY Right 2003   positive, chemo and radiation  . COLONOSCOPY WITH PROPOFOL N/A 12/30/2017   Procedure: COLONOSCOPY WITH PROPOFOL;  Surgeon: Jonathon Bellows, MD;  Location: [facility] ENDOSCOPY;  Service: Gastroenterology;  Laterality: N/A;  . MASTECTOMY Right 2006  . TUBAL LIGATION Bilateral    Family History  Problem Relation Age of Onset  . Cancer - Cervical Maternal Grandmother   . Heart disease Mother   . Heart failure Sister   . Breast cancer Cousin    Social History    Tobacco Use  . Smoking status: Never Smoker  . Smokeless tobacco: Never Used  Substance Use Topics  . Alcohol use: No    Alcohol/week: 0.0 standard drinks   Allergies  Allergen Reactions  . Bee Venom Swelling  . Sulfa Antibiotics Hives  . Vicodin [Hydrocodone-Acetaminophen] Nausea And Vomiting  . Latex Itching and Rash  . Penicillins Itching   Prior to Admission medications   Medication Sig Start Date End Date Taking? Authorizing Provider  albuterol (PROVENTIL HFA;VENTOLIN HFA) 108 (90 BASE) MCG/ACT inhaler Inhale 2 puffs into the lungs every 6 (six) hours as needed for wheezing or shortness of breath.   Yes [provider]  aspirin 81 MG tablet Take 81 mg by mouth daily.   Yes [provider]  BIOTIN PO Take 500 mcg by mouth daily.   Yes [provider]  Calcium Carb-Cholecalciferol 600-200 MG-UNIT TABS Take 1 tablet by mouth 2 (two) times daily.   Yes [provider]  carvedilol (COREG) 12.5 MG tablet TAKE 1 TABLET BY MOUTH 2 TIMES DAILY WITH A MEAL 10/16/17  Yes Hackney, Tina A, FNP  citalopram (CELEXA) 20 MG tablet Take 20 mg by mouth daily.   Yes [provider]  diclofenac (VOLTAREN) 75 MG EC tablet Take 75 mg by mouth daily. 06/27/17  Yes [provider]  ENTRESTO 24-26 MG TAKE 1 TABLET  BY MOUTH TWICE A DAY. 10/16/17  Yes Hackney, Tina A, FNP  fluticasone (FLONASE) 50 MCG/ACT nasal spray Place 2 sprays into both nostrils daily.   Yes [provider]  fluticasone (FLOVENT HFA) 110 MCG/ACT inhaler Inhale 2 puffs into the lungs 2 (two) times daily.   Yes [provider]  furosemide (LASIX) 20 MG tablet Take 20 mg by mouth daily.   Yes [provider]  gabapentin (NEURONTIN) 300 MG capsule Take 300 mg by mouth at bedtime.   Yes [provider]  glipiZIDE (GLUCOTROL XL) 10 MG 24 hr tablet Take 10 mg by mouth 2 (two) times daily.   Yes [provider]  loratadine (CLARITIN) 10 MG  tablet Take 10 mg by mouth daily.   Yes [provider]  meloxicam (MOBIC) 15 MG tablet Take 15 mg by mouth daily. 06/10/17  Yes [provider]  metFORMIN (GLUCOPHAGE) 500 MG tablet Take 1,000 mg by mouth 2 (two) times daily with a meal.    Yes [provider]  Multiple Vitamin (MULTIVITAMIN) capsule Take 1 capsule daily by mouth.   Yes [provider]  Omega 3 1000 MG CAPS Take 1 capsule by mouth daily.   Yes [provider]  omeprazole (PRILOSEC) 20 MG capsule Take 20 mg by mouth 2 (two) times daily before a meal.   Yes [provider]  oxybutynin (DITROPAN) 5 MG tablet Take 5 mg by mouth 2 (two) times daily.   Yes [provider]  simvastatin (ZOCOR) 40 MG tablet Take 40 mg by mouth daily.   Yes [provider]  spironolactone (ALDACTONE) 25 MG tablet Take 25 mg by mouth daily.   Yes [provider]  tiZANidine (ZANAFLEX) 4 MG capsule Take 4 mg by mouth 3 (three) times daily.   Yes [provider]  traZODone (DESYREL) 50 MG tablet Take 50 mg by mouth at bedtime.   Yes [provider]  vitamin C (ASCORBIC ACID) 500 MG tablet Take 500 mg by mouth daily.   Yes [provider]  exenatide (BYETTA) 5 MCG/0.02ML SOPN injection Inject 5 mcg 2 (two) times daily with a meal into the skin.    [provider]  magnesium oxide (MAG-OX) 400 MG tablet Take 400 mg daily by mouth.    [provider]  Melatonin 5 MG TABS Take 5 mg at bedtime as needed by mouth.    [provider]    Review of Systems  Constitutional: Negative for appetite change and fatigue.  HENT: Negative for congestion, postnasal drip and sore throat.   Eyes: Negative.   Respiratory: Negative for cough, chest tightness, shortness of breath and wheezing.   Cardiovascular: Negative for chest pain, palpitations and leg swelling.  Gastrointestinal: Negative for abdominal distention and abdominal pain.   Endocrine: Negative.   Genitourinary: Negative.   Musculoskeletal: Negative for arthralgias and back pain.           Skin: Negative.   Allergic/Immunologic: Negative.   Neurological: Negative for dizziness and light-headedness.  Hematological: Negative for adenopathy. Does not bruise/bleed easily.  Psychiatric/Behavioral: Positive for dysphoric mood. Negative for sleep disturbance (sleeping on 1 pillow) and suicidal ideas. The patient is not nervous/anxious.    Vitals:   09/29/18 1101  BP: (!) 92/46  Pulse: 75  Resp: 18  SpO2: 100%  Weight: 258 lb 2 oz (117.1 kg)  Height: 5\' 1"  (1.549 m)   Wt Readings from Last 3 Encounters:  09/29/18 258 lb 2  oz (117.1 kg)  12/30/17 248 lb (112.5 kg)  10/18/17 261 lb 8 oz (118.6 kg)   Lab Results  Component Value Date   CREATININE 1.03 (H) 08/02/2015   CREATININE 1.05 07/27/2014   CREATININE 1.07 07/02/2014    Physical Exam  Constitutional: She is oriented to person, place, and time. She appears well-developed and well-nourished.  HENT:  Head: Normocephalic and atraumatic.  Neck: Normal range of motion. Neck supple. No JVD present.  Cardiovascular: Normal rate and regular rhythm.  Pulmonary/Chest: Effort normal. She has no wheezes. She has no rales.  Abdominal: Soft. She exhibits no distension. There is no tenderness.  Musculoskeletal: She exhibits no edema or tenderness.  Neurological: She is alert and oriented to person, place, and time.  Skin: Skin is warm and dry.  Psychiatric: She has a normal mood and affect. Her behavior is normal. Thought content normal.  Nursing note and vitals reviewed.   Assessment & Plan  1: Chronic heart failure with preserved ejection fraction-  -NYHA Class I today -euvolemic today - weighing daily and she was reminded to call for an overnight weight gain of >2 pounds or weekly weight gain of >5 pounds.  - weight down 3 pounds since she was last here 11 months ago - Currently on entresto,  carvedilol, furosemide and spironolactone; unable to titrate doses due to BP - not adding salt and trying to read food labels - saw cardiologist Nehemiah Massed) 09/26/18 - ReDS vest reading not obtained due to BMI - patient reports receiving her flu vaccine for this season  2: Hypotension- - BP on the low side today but patient without dizziness - saw Clide Deutscher) October 2018 and returns next week - BMP 09/22/18 reviewed and showed sodium 136, potassium 4.2, creatinine 1.03 and GFR >60  3: Diabetes- - glucose yesterday was 120 - PCP follows her for her diabetes  Medication bottles were reviewed.  Return in 3 months or sooner for any questions/problems before then.

## 2018-11-17 ENCOUNTER — Other Ambulatory Visit: Payer: Self-pay | Admitting: Family

## 2018-12-06 ENCOUNTER — Other Ambulatory Visit: Payer: Self-pay | Admitting: Family

## 2018-12-28 NOTE — Progress Notes (Deleted)
Patient ID: Melissa Harrell, female    DOB: [birth year], 58 y.o.   MRN: [id number]  HPI  Melissa Harrell is a 58 year old female with a history of morbid obesity, COPD, diabetes, HTN, pulmonary emboli and systolic heart failure who returns today for a f/u visit.   Last echo done 02/08/17 which showed an EF of 50% along with mild MR/TR. Previous echo done 03/14/15 with an EF of 45% without any stenosis or regurgitation.   Has not been admitted or in the ED in the last 6 months.   Returns today with a chief complaint of a follow-up visit.   Past Medical History:  Diagnosis Date  . Anxiety   . Arthritis   . Asthma   . Breast cancer ([place]) [id number]   right   . CHF (congestive heart failure) ([place])   . COPD (chronic obstructive pulmonary disease) ([place])   . Depression   . Diabetes mellitus without complication ([place])   . GERD (gastroesophageal reflux disease)   . Hyperlipidemia   . Hypertension   . Personal history of chemotherapy   . Personal history of radiation therapy   . Pulmonary emboli [place])    Past Surgical History:  Procedure Laterality Date  . ABDOMINAL HYSTERECTOMY    . BREAST BIOPSY Right 2006   positive  . BREAST EXCISIONAL BIOPSY Right 2003   positive, chemo and radiation  . COLONOSCOPY WITH PROPOFOL N/A 12/30/2017   Procedure: COLONOSCOPY WITH PROPOFOL;  Surgeon: Jonathon Bellows, MD;  Location: [facility] ENDOSCOPY;  Service: Gastroenterology;  Laterality: N/A;  . MASTECTOMY Right 2006  . TUBAL LIGATION Bilateral    Family History  Problem Relation Age of Onset  . Cancer - Cervical Maternal Grandmother   . Heart disease Mother   . Heart failure Sister   . Breast cancer Cousin    Social History   Tobacco Use  . Smoking status: Never Smoker  . Smokeless tobacco: Never Used  Substance Use Topics  . Alcohol use: No    Alcohol/week: 0.0 standard drinks   Allergies  Allergen Reactions  . Bee Venom Swelling  . Sulfa Antibiotics Hives  . Vicodin [Hydrocodone-Acetaminophen] Nausea  And Vomiting  . Latex Itching and Rash  . Penicillins Itching     Review of Systems  Constitutional: Negative for appetite change and fatigue.  HENT: Negative for congestion, postnasal drip and sore throat.   Eyes: Negative.   Respiratory: Negative for cough, chest tightness, shortness of breath and wheezing.   Cardiovascular: Negative for chest pain, palpitations and leg swelling.  Gastrointestinal: Negative for abdominal distention and abdominal pain.  Endocrine: Negative.   Genitourinary: Negative.   Musculoskeletal: Negative for arthralgias and back pain.           Skin: Negative.   Allergic/Immunologic: Negative.   Neurological: Negative for dizziness and light-headedness.  Hematological: Negative for adenopathy. Does not bruise/bleed easily.  Psychiatric/Behavioral: Positive for dysphoric mood. Negative for sleep disturbance (sleeping on 1 pillow) and suicidal ideas. The patient is not nervous/anxious.      Physical Exam  Constitutional: She is oriented to person, place, and time. She appears well-developed and well-nourished.  HENT:  Head: Normocephalic and atraumatic.  Neck: Normal range of motion. Neck supple. No JVD present.  Cardiovascular: Normal rate and regular rhythm.  Pulmonary/Chest: Effort normal. She has no wheezes. She has no rales.  Abdominal: Soft. She exhibits no distension. There is no abdominal tenderness.  Musculoskeletal:        General: No  tenderness or edema.  Neurological: She is alert and oriented to person, place, and time.  Skin: Skin is warm and dry.  Psychiatric: She has a normal mood and affect. Her behavior is normal. Thought content normal.  Nursing note and vitals reviewed.   Assessment & Plan  1: Chronic heart failure with preserved ejection fraction-  -NYHA Class I today -euvolemic today - weighing daily and she was reminded to call for an overnight weight gain of >2 pounds or weekly weight gain of >5 pounds.  - weight  -  Currently on entresto, carvedilol, furosemide and spironolactone; unable to titrate doses due to BP - not adding salt and trying to read food labels - saw cardiologist Nehemiah Massed) 09/26/18 - ReDS vest reading not obtained due to BMI - patient reports receiving her flu vaccine for this season  2: Hypotension- - BP  - saw Clide Deutscher) October 2018 and returns next week - BMP 09/22/18 reviewed and showed sodium 136, potassium 4.2, creatinine 1.03 and GFR >60  3: Diabetes- - glucose yesterday was  - PCP follows her for her diabetes  Medication bottles were reviewed.

## 2018-12-29 ENCOUNTER — Ambulatory Visit: Payer: Medicare Other | Admitting: Family

## 2018-12-29 ENCOUNTER — Telehealth: Payer: Self-pay | Admitting: Family

## 2018-12-29 NOTE — Telephone Encounter (Signed)
Patient did not show for her Heart Failure Clinic appointment on 12/29/2018. Will attempt to reschedule.

## 2019-01-25 ENCOUNTER — Other Ambulatory Visit: Payer: Self-pay

## 2019-01-25 ENCOUNTER — Emergency Department
Admission: EM | Admit: 2019-01-25 | Discharge: 2019-01-25 | Disposition: A | Discharge status: Home / Self Care | Payer: Medicare Other | Attending: Emergency Medicine | Admitting: Emergency Medicine

## 2019-01-25 DIAGNOSIS — Z7984 Long term (current) use of oral hypoglycemic drugs: Secondary | ICD-10-CM | POA: Insufficient documentation

## 2019-01-25 DIAGNOSIS — Z79899 Other long term (current) drug therapy: Secondary | ICD-10-CM | POA: Insufficient documentation

## 2019-01-25 DIAGNOSIS — Z853 Personal history of malignant neoplasm of breast: Secondary | ICD-10-CM | POA: Diagnosis not present

## 2019-01-25 DIAGNOSIS — E119 Type 2 diabetes mellitus without complications: Secondary | ICD-10-CM | POA: Diagnosis not present

## 2019-01-25 DIAGNOSIS — Z7982 Long term (current) use of aspirin: Secondary | ICD-10-CM | POA: Insufficient documentation

## 2019-01-25 DIAGNOSIS — J449 Chronic obstructive pulmonary disease, unspecified: Secondary | ICD-10-CM | POA: Insufficient documentation

## 2019-01-25 DIAGNOSIS — I110 Hypertensive heart disease with heart failure: Secondary | ICD-10-CM | POA: Insufficient documentation

## 2019-01-25 DIAGNOSIS — B373 Candidiasis of vulva and vagina: Secondary | ICD-10-CM | POA: Diagnosis not present

## 2019-01-25 DIAGNOSIS — J45909 Unspecified asthma, uncomplicated: Secondary | ICD-10-CM | POA: Insufficient documentation

## 2019-01-25 DIAGNOSIS — Z91040 Latex allergy status: Secondary | ICD-10-CM | POA: Insufficient documentation

## 2019-01-25 DIAGNOSIS — I5032 Chronic diastolic (congestive) heart failure: Secondary | ICD-10-CM | POA: Diagnosis not present

## 2019-01-25 DIAGNOSIS — B3731 Acute candidiasis of vulva and vagina: Secondary | ICD-10-CM

## 2019-01-25 DIAGNOSIS — R300 Dysuria: Secondary | ICD-10-CM | POA: Diagnosis present

## 2019-01-25 LAB — URINALYSIS, ROUTINE W REFLEX MICROSCOPIC
Bilirubin Urine: NEGATIVE
GLUCOSE, UA: NEGATIVE mg/dL
Hgb urine dipstick: NEGATIVE
Ketones, ur: NEGATIVE mg/dL
Leukocytes,Ua: NEGATIVE
Nitrite: NEGATIVE
PH: 6 (ref 5.0–8.0)
Protein, ur: NEGATIVE mg/dL
Specific Gravity, Urine: 1.014 (ref 1.005–1.030)

## 2019-01-25 LAB — WET PREP, GENITAL
Clue Cells Wet Prep HPF POC: NONE SEEN
Sperm: NONE SEEN
Trich, Wet Prep: NONE SEEN

## 2019-01-25 MED ORDER — FLUCONAZOLE 100 MG PO TABS
150.0000 mg | ORAL_TABLET | Freq: Once | ORAL | Status: AC
  Administered 2019-01-25: 150 mg via ORAL
  Filled 2019-01-25: qty 1

## 2019-01-25 NOTE — ED Notes (Signed)
Gave pt urine cup with bag.  

## 2019-01-25 NOTE — ED Provider Notes (Addendum)
[facility] EMERGENCY DEPARTMENT Provider Note   CSN: [id number] Arrival date & time: 01/25/19  1303    History   Chief Complaint Chief Complaint  Patient presents with  . Dysuria    HPI Melissa Harrell is a 58 y.o. female.  Presents to the emergency department for evaluation of 2 days of dysuria.  She describes burning with urination as well as itching.  No vaginal discharge or bleeding.  She denies any back pain, fevers, abdominal pain, nausea or vomiting.  She denies any recent UTIs.  She denies any blood with urination.  No recent antibiotic use.     HPI  Past Medical History:  Diagnosis Date  . Anxiety   . Arthritis   . Asthma   . Breast cancer ([place]) [id number]   right   . CHF (congestive heart failure) ([place])   . COPD (chronic obstructive pulmonary disease) ([place])   . Depression   . Diabetes mellitus without complication ([place])   . GERD (gastroesophageal reflux disease)   . Hyperlipidemia   . Hypertension   . Personal history of chemotherapy   . Personal history of radiation therapy   . Pulmonary emboli [facility])     Patient Active Problem List   Diagnosis Date Noted  . Muscle spasm of left calf 07/10/2017  . Chronic diastolic heart failure ([place]) 04/07/2017  . Knee pain, bilateral 12/28/2016  . Strain of left biceps 04/26/2016  . Tachycardia 03/28/2016  . Hypotension 09/21/2015  . Cancer of breast ([place]) 08/02/2015  . Orthostatic hypotension 03/25/2015  . Diabetes mellitus type 2, controlled ([place]) 03/25/2015  . Morbid obesity ([place]) 03/25/2015    Past Surgical History:  Procedure Laterality Date  . ABDOMINAL HYSTERECTOMY    . BREAST BIOPSY Right 2006   positive  . BREAST EXCISIONAL BIOPSY Right 2003   positive, chemo and radiation  . COLONOSCOPY WITH PROPOFOL N/A 12/30/2017   Procedure: COLONOSCOPY WITH PROPOFOL;  Surgeon: Jonathon Bellows, MD;  Location: [facility] ENDOSCOPY;  Service: Gastroenterology;  Laterality: N/A;  . MASTECTOMY Right 2006  .  TUBAL LIGATION Bilateral      OB History   No obstetric history on file.      Home Medications    Prior to Admission medications   Medication Sig Start Date End Date Taking? Authorizing Provider  albuterol (PROVENTIL HFA;VENTOLIN HFA) 108 (90 BASE) MCG/ACT inhaler Inhale 2 puffs into the lungs every 6 (six) hours as needed for wheezing or shortness of breath.    [provider]  aspirin 81 MG tablet Take 81 mg by mouth daily.    [provider]  BIOTIN PO Take 500 mcg by mouth daily.    [provider]  Calcium Carb-Cholecalciferol 600-200 MG-UNIT TABS Take 1 tablet by mouth 2 (two) times daily.    [provider]  carvedilol (COREG) 12.5 MG tablet TAKE ONE TABLET BY MOUTH TWICE A DAY WITH A MEAL 11/17/18   Darylene Price A, FNP  citalopram (CELEXA) 20 MG tablet Take 20 mg by mouth daily.    [provider]  diclofenac (VOLTAREN) 75 MG EC tablet Take 75 mg by mouth daily. 06/27/17   [provider]  ENTRESTO 24-26 MG TAKE ONE TABLET BY MOUTH TWICE A DAY 12/06/18   Darylene Price A, FNP  exenatide (BYETTA) 5 MCG/0.02ML SOPN injection Inject 5 mcg 2 (two) times daily with a meal into the skin.    [provider]  fluticasone (FLONASE) 50 MCG/ACT nasal spray Place 2  sprays into both nostrils daily.    [provider]  fluticasone (FLOVENT HFA) 110 MCG/ACT inhaler Inhale 2 puffs into the lungs 2 (two) times daily.    [provider]  furosemide (LASIX) 20 MG tablet Take 20 mg by mouth daily.    [provider]  gabapentin (NEURONTIN) 300 MG capsule Take 300 mg by mouth at bedtime.    [provider]  glipiZIDE (GLUCOTROL XL) 10 MG 24 hr tablet Take 10 mg by mouth 2 (two) times daily.    [provider]  loratadine (CLARITIN) 10 MG tablet Take 10 mg by mouth daily.    [provider]  magnesium oxide (MAG-OX) 400 MG tablet Take 400 mg daily by mouth.    [provider]    Melatonin 5 MG TABS Take 5 mg at bedtime as needed by mouth.    [provider]  meloxicam (MOBIC) 15 MG tablet Take 15 mg by mouth daily. 06/10/17   [provider]  metFORMIN (GLUCOPHAGE) 500 MG tablet Take 1,000 mg by mouth 2 (two) times daily with a meal.     [provider]  Multiple Vitamin (MULTIVITAMIN) capsule Take 1 capsule daily by mouth.    [provider]  Omega 3 1000 MG CAPS Take 1 capsule by mouth daily.    [provider]  omeprazole (PRILOSEC) 20 MG capsule Take 20 mg by mouth 2 (two) times daily before a meal.    [provider]  oxybutynin (DITROPAN) 5 MG tablet Take 5 mg by mouth 2 (two) times daily.    [provider]  simvastatin (ZOCOR) 40 MG tablet Take 40 mg by mouth daily.    [provider]  spironolactone (ALDACTONE) 25 MG tablet Take 25 mg by mouth daily.    [provider]  tiZANidine (ZANAFLEX) 4 MG capsule Take 4 mg by mouth 3 (three) times daily.    [provider]  traZODone (DESYREL) 50 MG tablet Take 50 mg by mouth at bedtime.    [provider]  vitamin C (ASCORBIC ACID) 500 MG tablet Take 500 mg by mouth daily.    [provider]    Family History Family History  Problem Relation Age of Onset  . Cancer - Cervical Maternal Grandmother   . Heart disease Mother   . Heart failure Sister   . Breast cancer Cousin     Social History Social History   Tobacco Use  . Smoking status: Never Smoker  . Smokeless tobacco: Never Used  Substance Use Topics  . Alcohol use: No    Alcohol/week: 0.0 standard drinks  . Drug use: No     Allergies   Bee venom; Sulfa antibiotics; Vicodin [hydrocodone-acetaminophen]; Latex; and Penicillins   Review of Systems Review of Systems  Constitutional: Negative for fever.  Genitourinary: Positive for dysuria, urgency and vaginal pain. Negative for decreased urine volume, vaginal bleeding and vaginal discharge.   Musculoskeletal: Negative for back pain.  Skin: Negative for rash and wound.     Physical Exam Updated Vital Signs BP 128/68 (BP Location: Left Arm)   Pulse 85   Temp 98.2 F (36.8 C) (Oral)   Resp 18   Ht 5\' 1"  (1.549 m)   Wt 115.2 kg   LMP  (LMP Unknown)   SpO2 98%   BMI 47.99 kg/m   Physical Exam Constitutional:      Appearance: She is well-developed.  HENT:     Head: Normocephalic and atraumatic.  Eyes:     Conjunctiva/sclera: Conjunctivae normal.  Neck:     Musculoskeletal: Normal range of motion.  Cardiovascular:     Rate and Rhythm: Normal rate.  Pulmonary:     Effort: Pulmonary effort is normal. No respiratory distress.  Abdominal:     General: There is no distension.     Tenderness: There is no abdominal tenderness. There is no guarding.  Genitourinary:    Comments: Pelvic exam shows no vaginal discharge, bleeding.  No cervical motion tenderness.  No vaginal lesions. Musculoskeletal: Normal range of motion.  Skin:    General: Skin is warm.     Findings: No rash.  Neurological:     Mental Status: She is alert and oriented to person, place, and time.  Psychiatric:        Behavior: Behavior normal.        Thought Content: Thought content normal.      ED Treatments / Results  Labs (all labs ordered are listed, but only abnormal results are displayed) Labs Reviewed  WET PREP, GENITAL - Abnormal; Notable for the following components:      Result Value   Yeast Wet Prep HPF POC PRESENT (*)    WBC, Wet Prep HPF POC FEW (*)    All other components within normal limits  URINALYSIS, ROUTINE W REFLEX MICROSCOPIC - Abnormal; Notable for the following components:   Color, Urine YELLOW (*)    APPearance CLEAR (*)    All other components within normal limits  URINE CULTURE    EKG None  Radiology No results found.  Procedures Procedures (including critical care time)  Medications Ordered in ED Medications  fluconazole (DIFLUCAN) tablet 150 mg (150  mg Oral Given 01/25/19 1543)     Initial Impression / Assessment and Plan / ED Course  I have reviewed the triage vital signs and the nursing notes.  Pertinent labs & imaging results that were available during my care of the patient were reviewed by me and considered in my medical decision making (see chart for details).        58 year old female with yeast vaginitis.  She is given fluconazole 150 mg p.o. x1.  She understands signs and symptoms return to the ED for.  Final Clinical Impressions(s) / ED Diagnoses   Final diagnoses:  Yeast vaginitis    ED Discharge Orders    None       Duanne Guess, PA-C 01/25/19 1547    Duanne Guess, PA-C 01/25/19 1547    Lavonia Drafts, MD 01/28/19 [phone]

## 2019-01-25 NOTE — Discharge Instructions (Addendum)
Please take fluconazole as prescribed.  If no resolution of your symptoms, fevers, increasing pain/discomfort, return to the emergency department.

## 2019-01-25 NOTE — ED Triage Notes (Signed)
Dysuria and frequency x 2 days. A&O, ambulatory. No distress noted. Denies fever. Denies hematuria.

## 2019-01-26 LAB — URINE CULTURE: Culture: <10000 — AB

## 2019-06-23 ENCOUNTER — Other Ambulatory Visit: Payer: Self-pay | Admitting: Family Medicine

## 2019-06-29 ENCOUNTER — Other Ambulatory Visit: Payer: Self-pay | Admitting: Family Medicine

## 2019-06-29 DIAGNOSIS — Z1231 Encounter for screening mammogram for malignant neoplasm of breast: Secondary | ICD-10-CM

## 2019-08-07 ENCOUNTER — Ambulatory Visit
Admission: RE | Admit: 2019-08-07 | Discharge: 2019-08-07 | Disposition: A | Discharge status: Home / Self Care | Payer: Medicare Other | Source: Ambulatory Visit | Attending: Family Medicine | Admitting: Family Medicine

## 2019-08-07 DIAGNOSIS — Z1231 Encounter for screening mammogram for malignant neoplasm of breast: Secondary | ICD-10-CM | POA: Diagnosis not present

## 2019-12-08 ENCOUNTER — Other Ambulatory Visit: Payer: Self-pay | Admitting: Family

## 2019-12-10 ENCOUNTER — Other Ambulatory Visit: Payer: Self-pay | Admitting: Family

## 2019-12-10 MED ORDER — CARVEDILOL 12.5 MG PO TABS: ORAL_TABLET | ORAL | 0 refills | Status: DC

## 2019-12-14 NOTE — Progress Notes (Signed)
Patient ID: Melissa Harrell, female    DOB: [birth year], 59 y.o.   MRN: [id number]  HPI  Melissa Harrell is a 59 year old female with a history of morbid obesity, COPD, diabetes, HTN, pulmonary emboli and systolic heart failure who returns today for a f/u visit.   Last echo done 02/08/17 which showed an EF of 50% along with mild MR/TR. Previous echo done 03/14/15 with an EF of 45% without any stenosis or regurgitation.   Has not been admitted or in the ED in the last 6 months.   Returns today with a chief complaint of a follow-up visit. Patient currently without any symptoms and specifically denies any difficulty sleeping, abdominal distention, palpitations, pedal edema, chest pain, shortness of breath, cough, dizziness, fatigue or weight gain.   She says that she has been eating healthier and trying to closely monitor her sodium intake.   Past Medical History:  Diagnosis Date  . Anxiety   . Arthritis   . Asthma   . Breast cancer ([place]) [place]:[id number]   right   . CHF (congestive heart failure) ([place])   . COPD (chronic obstructive pulmonary disease) ([place])   . Depression   . Diabetes mellitus without complication ([place])   . GERD (gastroesophageal reflux disease)   . Hyperlipidemia   . Hypertension   . Personal history of chemotherapy   . Personal history of radiation therapy   . Pulmonary emboli [facility])    Past Surgical History:  Procedure Laterality Date  . ABDOMINAL HYSTERECTOMY    . BREAST BIOPSY Right 2006   positive  . BREAST EXCISIONAL BIOPSY Right 2003   positive, chemo and radiation  . COLONOSCOPY WITH PROPOFOL N/A 12/30/2017   Procedure: COLONOSCOPY WITH PROPOFOL;  Surgeon: Jonathon Bellows, MD;  Location: [facility] ENDOSCOPY;  Service: Gastroenterology;  Laterality: N/A;  . MASTECTOMY Right 2006  . TUBAL LIGATION Bilateral    Family History  Problem Relation Age of Onset  . Cancer - Cervical Maternal Grandmother   . Heart disease Mother   . Heart failure Sister   . Breast cancer Cousin     Social History   Tobacco Use  . Smoking status: Never Smoker  . Smokeless tobacco: Never Used  Substance Use Topics  . Alcohol use: No    Alcohol/week: 0.0 standard drinks   Allergies  Allergen Reactions  . Bee Venom Swelling  . Sulfa Antibiotics Hives  . Vicodin [Hydrocodone-Acetaminophen] Nausea And Vomiting  . Latex Itching and Rash  . Penicillins Itching   Prior to Admission medications   Medication Sig Start Date End Date Taking? Authorizing Provider  albuterol (PROVENTIL HFA;VENTOLIN HFA) 108 (90 BASE) MCG/ACT inhaler Inhale 2 puffs into the lungs every 6 (six) hours as needed for wheezing or shortness of breath.   Yes [provider]  aspirin 81 MG tablet Take 81 mg by mouth daily.   Yes [provider]  BIOTIN PO Take 500 mcg by mouth daily.   Yes [provider]  Calcium Carb-Cholecalciferol 600-200 MG-UNIT TABS Take 1 tablet by mouth 2 (two) times daily.   Yes [provider]  carvedilol (COREG) 12.5 MG tablet TAKE ONE TABLET BY MOUTH TWICE A DAY WITH A MEAL 12/10/19  Yes Jackie Littlejohn A, FNP  citalopram (CELEXA) 20 MG tablet Take 20 mg by mouth daily.   Yes [provider]  diclofenac (VOLTAREN) 75 MG EC tablet Take 75 mg by mouth daily. 06/27/17  Yes [provider]  empagliflozin (JARDIANCE) 25 MG  TABS tablet Take 25 mg by mouth daily.   Yes [provider]  ENTRESTO 24-26 MG TAKE ONE TABLET BY MOUTH TWICE A DAY 12/06/18  Yes Kamyla Olejnik A, FNP  fluticasone (FLONASE) 50 MCG/ACT nasal spray Place 2 sprays into both nostrils daily.   Yes [provider]  fluticasone (FLOVENT HFA) 110 MCG/ACT inhaler Inhale 2 puffs into the lungs 2 (two) times daily.   Yes [provider]  furosemide (LASIX) 20 MG tablet Take 20 mg by mouth daily.   Yes [provider]  gabapentin (NEURONTIN) 300 MG capsule Take 300 mg by mouth at bedtime.   Yes [provider]  loratadine (CLARITIN) 10 MG  tablet Take 10 mg by mouth daily.   Yes [provider]  magnesium oxide (MAG-OX) 400 MG tablet Take 400 mg daily by mouth.   Yes [provider]  meloxicam (MOBIC) 15 MG tablet Take 15 mg by mouth daily. 06/10/17  Yes [provider]  metFORMIN (GLUCOPHAGE) 500 MG tablet Take 1,000 mg by mouth 2 (two) times daily with a meal.    Yes [provider]  Multiple Vitamin (MULTIVITAMIN) capsule Take 1 capsule daily by mouth.   Yes [provider]  Omega 3 1000 MG CAPS Take 1 capsule by mouth daily.   Yes [provider]  omeprazole (PRILOSEC) 20 MG capsule Take 20 mg by mouth 2 (two) times daily before a meal.   Yes [provider]  oxybutynin (DITROPAN) 5 MG tablet Take 5 mg by mouth 2 (two) times daily.   Yes [provider]  simvastatin (ZOCOR) 40 MG tablet Take 40 mg by mouth daily.   Yes [provider]  spironolactone (ALDACTONE) 25 MG tablet Take 25 mg by mouth daily.   Yes [provider]  tiZANidine (ZANAFLEX) 4 MG capsule Take 4 mg by mouth 3 (three) times daily.   Yes [provider]  traZODone (DESYREL) 50 MG tablet Take 50 mg by mouth at bedtime.   Yes [provider]  vitamin C (ASCORBIC ACID) 500 MG tablet Take 500 mg by mouth daily.   Yes [provider]     Review of Systems  Constitutional: Negative for appetite change and fatigue.  HENT: Negative for congestion, postnasal drip and sore throat.   Eyes: Negative.   Respiratory: Negative for cough, chest tightness, shortness of breath and wheezing.   Cardiovascular: Negative for chest pain, palpitations and leg swelling.  Gastrointestinal: Negative for abdominal distention and abdominal pain.  Endocrine: Negative.   Genitourinary: Negative.   Musculoskeletal: Negative for arthralgias and back pain.  Skin: Negative.   Allergic/Immunologic: Negative.   Neurological: Negative for dizziness and light-headedness.   Hematological: Negative for adenopathy. Does not bruise/bleed easily.  Psychiatric/Behavioral: Negative for dysphoric mood, sleep disturbance (sleeping on 1 pillow) and suicidal ideas. The patient is not nervous/anxious.    Vitals:   12/15/19 1410  BP: (!) 134/45  Pulse: 87  Resp: 20  SpO2: 98%  Weight: 256 lb 12.8 oz (116.5 kg)  Height: 5\' 1"  (1.549 m)   Wt Readings from Last 3 Encounters:  12/15/19 256 lb 12.8 oz (116.5 kg)  01/25/19 254 lb (115.2 kg)  09/29/18 258 lb 2 oz (117.1 kg)   Lab Results  Component Value Date   CREATININE 1.03 (H) 08/02/2015   CREATININE 1.05 07/27/2014   CREATININE 1.07 07/02/2014    Physical Exam  Constitutional: She is oriented to person, place, and time. She appears well-developed  and well-nourished.  HENT:  Head: Normocephalic and atraumatic.  Neck: No JVD present.  Cardiovascular: Normal rate and regular rhythm.  Pulmonary/Chest: Effort normal. She has no wheezes. She has no rales.  Abdominal: Soft. She exhibits no distension. There is no abdominal tenderness.  Musculoskeletal:        General: No tenderness or edema.     Cervical back: Normal range of motion and neck supple.  Neurological: She is alert and oriented to person, place, and time.  Skin: Skin is warm and dry.  Psychiatric: She has a normal mood and affect. Her behavior is normal. Thought content normal.  Nursing note and vitals reviewed.   Assessment & Plan  1: Chronic heart failure with preserved ejection fraction-  -NYHA Class I today -euvolemic today - weighing daily and she was reminded to call for an overnight weight gain of >2 pounds or weekly weight gain of >5 pounds.  - Currently on entresto, carvedilol, furosemide and spironolactone - not adding salt and trying to read food labels - saw cardiologist Nehemiah Massed) 09/23/2019 - patient reports receiving her flu vaccine for this season - need to get updated echo at return visit if not done by cardiology   2:  Hypotension- - BP looks good today - saw ([place]) in the last few months - BMP 09/22/18 reviewed and showed sodium 136, potassium 4.2, creatinine 1.03 and GFR >60  3: Diabetes- - glucose in clinic today was 146 - PCP follows her for her diabetes  Medication bottles were reviewed.  Return in 6 months or sooner for any questions/problems before then.

## 2019-12-15 ENCOUNTER — Ambulatory Visit: Payer: Medicare Other | Attending: Family | Admitting: Family

## 2019-12-15 ENCOUNTER — Encounter: Payer: Self-pay | Admitting: Family

## 2019-12-15 ENCOUNTER — Other Ambulatory Visit: Payer: Self-pay

## 2019-12-15 VITALS — BP 134/45 | HR 87 | Resp 20 | Ht 61.0 in | Wt 256.8 lb

## 2019-12-15 DIAGNOSIS — Z9221 Personal history of antineoplastic chemotherapy: Secondary | ICD-10-CM | POA: Insufficient documentation

## 2019-12-15 DIAGNOSIS — Z8049 Family history of malignant neoplasm of other genital organs: Secondary | ICD-10-CM | POA: Insufficient documentation

## 2019-12-15 DIAGNOSIS — Z79899 Other long term (current) drug therapy: Secondary | ICD-10-CM | POA: Insufficient documentation

## 2019-12-15 DIAGNOSIS — I959 Hypotension, unspecified: Secondary | ICD-10-CM | POA: Diagnosis not present

## 2019-12-15 DIAGNOSIS — I5022 Chronic systolic (congestive) heart failure: Secondary | ICD-10-CM | POA: Insufficient documentation

## 2019-12-15 DIAGNOSIS — Z885 Allergy status to narcotic agent status: Secondary | ICD-10-CM | POA: Diagnosis not present

## 2019-12-15 DIAGNOSIS — J449 Chronic obstructive pulmonary disease, unspecified: Secondary | ICD-10-CM | POA: Insufficient documentation

## 2019-12-15 DIAGNOSIS — Z923 Personal history of irradiation: Secondary | ICD-10-CM | POA: Insufficient documentation

## 2019-12-15 DIAGNOSIS — Z8249 Family history of ischemic heart disease and other diseases of the circulatory system: Secondary | ICD-10-CM | POA: Diagnosis not present

## 2019-12-15 DIAGNOSIS — Z7984 Long term (current) use of oral hypoglycemic drugs: Secondary | ICD-10-CM | POA: Insufficient documentation

## 2019-12-15 DIAGNOSIS — E785 Hyperlipidemia, unspecified: Secondary | ICD-10-CM | POA: Diagnosis not present

## 2019-12-15 DIAGNOSIS — Z882 Allergy status to sulfonamides status: Secondary | ICD-10-CM | POA: Diagnosis not present

## 2019-12-15 DIAGNOSIS — Z86711 Personal history of pulmonary embolism: Secondary | ICD-10-CM | POA: Insufficient documentation

## 2019-12-15 DIAGNOSIS — E119 Type 2 diabetes mellitus without complications: Secondary | ICD-10-CM | POA: Diagnosis not present

## 2019-12-15 DIAGNOSIS — Z6841 Body Mass Index (BMI) 40.0 and over, adult: Secondary | ICD-10-CM | POA: Diagnosis not present

## 2019-12-15 DIAGNOSIS — Z880 Allergy status to penicillin: Secondary | ICD-10-CM | POA: Insufficient documentation

## 2019-12-15 DIAGNOSIS — K219 Gastro-esophageal reflux disease without esophagitis: Secondary | ICD-10-CM | POA: Insufficient documentation

## 2019-12-15 DIAGNOSIS — Z9851 Tubal ligation status: Secondary | ICD-10-CM | POA: Diagnosis not present

## 2019-12-15 DIAGNOSIS — Z853 Personal history of malignant neoplasm of breast: Secondary | ICD-10-CM | POA: Insufficient documentation

## 2019-12-15 DIAGNOSIS — Z91040 Latex allergy status: Secondary | ICD-10-CM | POA: Insufficient documentation

## 2019-12-15 DIAGNOSIS — Z803 Family history of malignant neoplasm of breast: Secondary | ICD-10-CM | POA: Diagnosis not present

## 2019-12-15 DIAGNOSIS — F419 Anxiety disorder, unspecified: Secondary | ICD-10-CM | POA: Insufficient documentation

## 2019-12-15 DIAGNOSIS — Z90710 Acquired absence of both cervix and uterus: Secondary | ICD-10-CM | POA: Diagnosis not present

## 2019-12-15 DIAGNOSIS — Z791 Long term (current) use of non-steroidal anti-inflammatories (NSAID): Secondary | ICD-10-CM | POA: Insufficient documentation

## 2019-12-15 DIAGNOSIS — I952 Hypotension due to drugs: Secondary | ICD-10-CM

## 2019-12-15 DIAGNOSIS — Z9011 Acquired absence of right breast and nipple: Secondary | ICD-10-CM | POA: Insufficient documentation

## 2019-12-15 DIAGNOSIS — M1990 Unspecified osteoarthritis, unspecified site: Secondary | ICD-10-CM | POA: Insufficient documentation

## 2019-12-15 DIAGNOSIS — F329 Major depressive disorder, single episode, unspecified: Secondary | ICD-10-CM | POA: Insufficient documentation

## 2019-12-15 DIAGNOSIS — Z91030 Bee allergy status: Secondary | ICD-10-CM | POA: Insufficient documentation

## 2019-12-15 DIAGNOSIS — I509 Heart failure, unspecified: Secondary | ICD-10-CM | POA: Diagnosis present

## 2019-12-15 DIAGNOSIS — Z7982 Long term (current) use of aspirin: Secondary | ICD-10-CM | POA: Insufficient documentation

## 2019-12-15 DIAGNOSIS — I110 Hypertensive heart disease with heart failure: Secondary | ICD-10-CM | POA: Diagnosis not present

## 2019-12-15 DIAGNOSIS — I5032 Chronic diastolic (congestive) heart failure: Secondary | ICD-10-CM

## 2019-12-15 LAB — GLUCOSE, CAPILLARY: Glucose-Capillary: 146 mg/dL — ABNORMAL HIGH (ref 70–99)

## 2019-12-15 NOTE — Patient Instructions (Signed)
Continue weighing daily and call for an overnight weight gain of > 2 pounds or a weekly weight gain of >5 pounds. 

## 2019-12-25 ENCOUNTER — Other Ambulatory Visit: Payer: Self-pay | Admitting: Family

## 2020-03-04 ENCOUNTER — Ambulatory Visit: Payer: Medicare Other | Attending: Internal Medicine

## 2020-03-04 DIAGNOSIS — Z23 Encounter for immunization: Secondary | ICD-10-CM

## 2020-03-04 NOTE — Progress Notes (Signed)
   Covid-19 Vaccination Clinic  Name:  Melissa Harrell    MRN: [id number] DOB: January 29, 1961  03/04/2020  [name] was observed post Covid-19 immunization for 15 minutes without incident. She was provided with Vaccine Information Sheet and instruction to access the V-Safe system.   [name] was instructed to call 911 with any severe reactions post vaccine: Marland Kitchen Difficulty breathing  . Swelling of face and throat  . A fast heartbeat  . A bad rash all over body  . Dizziness and weakness   Immunizations Administered    Name Date Dose VIS Date Route   Pfizer COVID-19 Vaccine 03/04/2020  2:51 PM 0.3 mL 11/13/2019 Intramuscular   Manufacturer: [place]   Lot: [id number]   [place]: [id number]

## 2020-03-07 ENCOUNTER — Other Ambulatory Visit: Payer: Self-pay | Admitting: Family

## 2020-03-25 ENCOUNTER — Ambulatory Visit: Payer: Medicare Other | Attending: Internal Medicine

## 2020-03-25 DIAGNOSIS — Z23 Encounter for immunization: Secondary | ICD-10-CM

## 2020-03-25 NOTE — Progress Notes (Signed)
   Covid-19 Vaccination Clinic  Name:  Melissa Harrell    MRN: [id number] DOB: February 21, 1961  03/25/2020  [name] was observed post Covid-19 immunization for 15 minutes without incident. She was provided with Vaccine Information Sheet and instruction to access the V-Safe system.   [name] was instructed to call 911 with any severe reactions post vaccine: Marland Kitchen Difficulty breathing  . Swelling of face and throat  . A fast heartbeat  . A bad rash all over body  . Dizziness and weakness   Immunizations Administered    Name Date Dose VIS Date Route   Pfizer COVID-19 Vaccine 03/25/2020  1:22 PM 0.3 mL 01/27/2019 Intramuscular   Manufacturer: [employer], [employer]   Lot: [id number]   [place]: [id number]

## 2020-06-17 ENCOUNTER — Ambulatory Visit: Payer: Medicare Other | Admitting: Family

## 2020-06-30 ENCOUNTER — Other Ambulatory Visit: Payer: Self-pay | Admitting: Family Medicine

## 2020-06-30 DIAGNOSIS — Z1231 Encounter for screening mammogram for malignant neoplasm of breast: Secondary | ICD-10-CM

## 2020-07-20 ENCOUNTER — Encounter: Payer: Self-pay | Admitting: Family

## 2020-07-20 ENCOUNTER — Other Ambulatory Visit: Payer: Self-pay

## 2020-07-20 ENCOUNTER — Ambulatory Visit: Payer: Medicare Other | Attending: Family | Admitting: Family

## 2020-07-20 VITALS — BP 111/75 | HR 80 | Resp 18 | Ht 61.0 in | Wt 255.5 lb

## 2020-07-20 DIAGNOSIS — Z7984 Long term (current) use of oral hypoglycemic drugs: Secondary | ICD-10-CM | POA: Diagnosis not present

## 2020-07-20 DIAGNOSIS — Z791 Long term (current) use of non-steroidal anti-inflammatories (NSAID): Secondary | ICD-10-CM | POA: Insufficient documentation

## 2020-07-20 DIAGNOSIS — M1990 Unspecified osteoarthritis, unspecified site: Secondary | ICD-10-CM | POA: Insufficient documentation

## 2020-07-20 DIAGNOSIS — E785 Hyperlipidemia, unspecified: Secondary | ICD-10-CM | POA: Insufficient documentation

## 2020-07-20 DIAGNOSIS — I5032 Chronic diastolic (congestive) heart failure: Secondary | ICD-10-CM

## 2020-07-20 DIAGNOSIS — Z885 Allergy status to narcotic agent status: Secondary | ICD-10-CM | POA: Insufficient documentation

## 2020-07-20 DIAGNOSIS — J449 Chronic obstructive pulmonary disease, unspecified: Secondary | ICD-10-CM | POA: Diagnosis not present

## 2020-07-20 DIAGNOSIS — K219 Gastro-esophageal reflux disease without esophagitis: Secondary | ICD-10-CM | POA: Insufficient documentation

## 2020-07-20 DIAGNOSIS — F329 Major depressive disorder, single episode, unspecified: Secondary | ICD-10-CM | POA: Insufficient documentation

## 2020-07-20 DIAGNOSIS — I952 Hypotension due to drugs: Secondary | ICD-10-CM

## 2020-07-20 DIAGNOSIS — I959 Hypotension, unspecified: Secondary | ICD-10-CM | POA: Insufficient documentation

## 2020-07-20 DIAGNOSIS — Z882 Allergy status to sulfonamides status: Secondary | ICD-10-CM | POA: Diagnosis not present

## 2020-07-20 DIAGNOSIS — I5042 Chronic combined systolic (congestive) and diastolic (congestive) heart failure: Secondary | ICD-10-CM | POA: Insufficient documentation

## 2020-07-20 DIAGNOSIS — F419 Anxiety disorder, unspecified: Secondary | ICD-10-CM | POA: Insufficient documentation

## 2020-07-20 DIAGNOSIS — I110 Hypertensive heart disease with heart failure: Secondary | ICD-10-CM | POA: Insufficient documentation

## 2020-07-20 DIAGNOSIS — Z86711 Personal history of pulmonary embolism: Secondary | ICD-10-CM | POA: Insufficient documentation

## 2020-07-20 DIAGNOSIS — Z79899 Other long term (current) drug therapy: Secondary | ICD-10-CM | POA: Diagnosis not present

## 2020-07-20 DIAGNOSIS — Z7951 Long term (current) use of inhaled steroids: Secondary | ICD-10-CM | POA: Insufficient documentation

## 2020-07-20 DIAGNOSIS — Z853 Personal history of malignant neoplasm of breast: Secondary | ICD-10-CM | POA: Diagnosis not present

## 2020-07-20 DIAGNOSIS — Z880 Allergy status to penicillin: Secondary | ICD-10-CM | POA: Insufficient documentation

## 2020-07-20 DIAGNOSIS — E119 Type 2 diabetes mellitus without complications: Secondary | ICD-10-CM | POA: Diagnosis not present

## 2020-07-20 DIAGNOSIS — Z7982 Long term (current) use of aspirin: Secondary | ICD-10-CM | POA: Insufficient documentation

## 2020-07-20 LAB — GLUCOSE, CAPILLARY: Glucose-Capillary: 161 mg/dL — ABNORMAL HIGH (ref 70–99)

## 2020-07-20 NOTE — Progress Notes (Signed)
Patient ID: Melissa Harrell, female    DOB: 08-01-1961, 59 y.o.   MRN: [id number]  HPI  Melissa Harrell is a 59 year old female with a history of morbid obesity, COPD, diabetes, HTN, pulmonary emboli and systolic heart failure who returns today for a f/u visit.   Last echo done 02/08/17 which showed an EF of 50% along with mild MR/TR. Previous echo done 03/14/15 with an EF of 45% without any stenosis or regurgitation.   Has not been admitted or in the ED in the last 6 months.   Returns today with a chief complaint of a follow-up visit. Currently denies any dizziness, abdominal distention, palpitations, pedal edema, chest pain, shortness of breath, cough or fatigue. She reports difficulty sleeping last night due to some stress with her 106 yr old granddaughter and a nephew that is currently on a ventilator from COVID pneumonia.   Past Medical History:  Diagnosis Date  . Anxiety   . Arthritis   . Asthma   . Breast cancer ([place]) [id number]   right   . CHF (congestive heart failure) ([place])   . COPD (chronic obstructive pulmonary disease) ([place])   . Depression   . Diabetes mellitus without complication ([place])   . GERD (gastroesophageal reflux disease)   . Hyperlipidemia   . Hypertension   . Personal history of chemotherapy   . Personal history of radiation therapy   . Pulmonary emboli [facility])    Past Surgical History:  Procedure Laterality Date  . ABDOMINAL HYSTERECTOMY    . BREAST BIOPSY Right 2006   positive  . BREAST EXCISIONAL BIOPSY Right 2003   positive, chemo and radiation  . COLONOSCOPY WITH PROPOFOL N/A 12/30/2017   Procedure: COLONOSCOPY WITH PROPOFOL;  Surgeon: Jonathon Bellows, MD;  Location: [facility] ENDOSCOPY;  Service: Gastroenterology;  Laterality: N/A;  . MASTECTOMY Right 2006  . TUBAL LIGATION Bilateral    Family History  Problem Relation Age of Onset  . Cancer - Cervical Maternal Grandmother   . Heart disease Mother   . Heart failure Sister   . Breast cancer Cousin    Social History    Tobacco Use  . Smoking status: Never Smoker  . Smokeless tobacco: Never Used  Substance Use Topics  . Alcohol use: No    Alcohol/week: 0.0 standard drinks   Allergies  Allergen Reactions  . Bee Venom Swelling  . Sulfa Antibiotics Hives  . Vicodin [Hydrocodone-Acetaminophen] Nausea And Vomiting  . Latex Itching and Rash  . Penicillins Itching   Prior to Admission medications   Medication Sig Start Date End Date Taking? Authorizing Provider  albuterol (PROVENTIL HFA;VENTOLIN HFA) 108 (90 BASE) MCG/ACT inhaler Inhale 2 puffs into the lungs every 6 (six) hours as needed for wheezing or shortness of breath.   Yes [provider]  aspirin 81 MG tablet Take 81 mg by mouth daily.   Yes [provider]  BIOTIN PO Take 500 mcg by mouth daily.   Yes [provider]  Calcium Carb-Cholecalciferol 600-200 MG-UNIT TABS Take 1 tablet by mouth 2 (two) times daily.   Yes [provider]  carvedilol (COREG) 12.5 MG tablet TAKE 1 TABLET BY MOUTH TWICE A DAY WITH A MEAL 03/07/20  Yes Zayra Devito A, FNP  citalopram (CELEXA) 20 MG tablet Take 20 mg by mouth daily.   Yes [provider]  diclofenac (VOLTAREN) 75 MG EC tablet Take 75 mg by mouth daily. 06/27/17  Yes [provider]  empagliflozin (JARDIANCE) 25 MG  TABS tablet Take 25 mg by mouth daily.   Yes [provider]  ENTRESTO 24-26 MG TAKE ONE TABLET BY MOUTH TWICE A DAY 12/25/19  Yes Kloi Brodman A, FNP  fluticasone (FLONASE) 50 MCG/ACT nasal spray Place 2 sprays into both nostrils daily.   Yes [provider]  fluticasone (FLOVENT HFA) 110 MCG/ACT inhaler Inhale 2 puffs into the lungs 2 (two) times daily.   Yes [provider]  furosemide (LASIX) 20 MG tablet Take 20 mg by mouth daily.   Yes [provider]  gabapentin (NEURONTIN) 300 MG capsule Take 300 mg by mouth at bedtime.   Yes [provider]  loratadine (CLARITIN) 10 MG tablet Take 10 mg by  mouth daily.   Yes [provider]  magnesium oxide (MAG-OX) 400 MG tablet Take 400 mg daily by mouth.   Yes [provider]  meloxicam (MOBIC) 15 MG tablet Take 15 mg by mouth daily. 06/10/17  Yes [provider]  metFORMIN (GLUCOPHAGE) 500 MG tablet Take 1,000 mg by mouth 2 (two) times daily with a meal.    Yes [provider]  Multiple Vitamin (MULTIVITAMIN) capsule Take 1 capsule daily by mouth.   Yes [provider]  Omega 3 1000 MG CAPS Take 1 capsule by mouth daily.   Yes [provider]  omeprazole (PRILOSEC) 20 MG capsule Take 20 mg by mouth 2 (two) times daily before a meal.   Yes [provider]  oxybutynin (DITROPAN) 5 MG tablet Take 5 mg by mouth 2 (two) times daily.   Yes [provider]  simvastatin (ZOCOR) 40 MG tablet Take 40 mg by mouth daily.   Yes [provider]  spironolactone (ALDACTONE) 25 MG tablet Take 25 mg by mouth daily.   Yes [provider]  tiZANidine (ZANAFLEX) 4 MG capsule Take 4 mg by mouth 3 (three) times daily.   Yes [provider]  traZODone (DESYREL) 50 MG tablet Take 50 mg by mouth at bedtime.   Yes [provider]  vitamin C (ASCORBIC ACID) 500 MG tablet Take 500 mg by mouth daily.   Yes [provider]     Review of Systems  Constitutional: Negative for appetite change and fatigue.  HENT: Negative for congestion, postnasal drip and sore throat.   Eyes: Negative.   Respiratory: Negative for cough, chest tightness, shortness of breath and wheezing.   Cardiovascular: Negative for chest pain, palpitations and leg swelling.  Gastrointestinal: Negative for abdominal distention and abdominal pain.  Endocrine: Negative.   Genitourinary: Negative.   Musculoskeletal: Negative for arthralgias and back pain.  Skin: Negative.   Allergic/Immunologic: Negative.   Neurological: Negative for dizziness and light-headedness.  Hematological: Negative  for adenopathy. Does not bruise/bleed easily.  Psychiatric/Behavioral: Positive for sleep disturbance (didn't sleep well last night). Negative for dysphoric mood and suicidal ideas. The patient is nervous/anxious (due to stress w/ granddaughter).    Vitals:   07/20/20 0934  BP: 111/75  Pulse: 80  Resp: 18  SpO2: 100%  Weight: 255 lb 8 oz (115.9 kg)  Height: 5\' 1"  (1.549 m)   Wt Readings from Last 3 Encounters:  07/20/20 255 lb 8 oz (115.9 kg)  12/15/19 256 lb 12.8 oz (116.5 kg)  01/25/19 254 lb (115.2 kg)   Lab Results  Component Value Date   CREATININE 1.03 (H) 08/02/2015   CREATININE 1.05 07/27/2014   CREATININE 1.07 07/02/2014    Physical Exam Vitals and nursing note reviewed.  Constitutional:  Appearance: She is well-developed.  HENT:     Head: Normocephalic and atraumatic.  Neck:     Vascular: No JVD.  Cardiovascular:     Rate and Rhythm: Normal rate and regular rhythm.  Pulmonary:     Effort: Pulmonary effort is normal.     Breath sounds: No wheezing or rales.  Abdominal:     General: There is no distension.     Palpations: Abdomen is soft.     Tenderness: There is no abdominal tenderness.  Musculoskeletal:        General: No tenderness.     Cervical back: Normal range of motion and neck supple.  Skin:    General: Skin is warm and dry.  Neurological:     Mental Status: She is alert and oriented to person, place, and time.  Psychiatric:        Behavior: Behavior normal.        Thought Content: Thought content normal.     Assessment & Plan  1: Chronic heart failure with preserved ejection fraction-  -NYHA Class I today -euvolemic today - weighing daily and she was reminded to call for an overnight weight gain of >2 pounds or weekly weight gain of >5 pounds.  - weight stable from last visit here 6 months ago - not adding salt and trying to read food labels - saw cardiologist Nehemiah Massed) 03/24/20 - message sent to cardiology about ordering echo in  their office - reports receiving both COVID vaccines  2: Hypotension- - BP looks good today - saw ([place]) in the last few months - BMP 09/22/18 reviewed and showed sodium 136, potassium 4.2, creatinine 1.03 and GFR >60  3: Diabetes- - glucose in clinic today was 161 - PCP follows her for her diabetes   Patient did not bring her medications nor a list. Each medication was verbally reviewed with the patient and she was encouraged to bring the bottles to every visit to confirm accuracy of list..  Due to HF stability, will not make a return appointment for patient at this time. Advised her that she could call back at anytime to schedule another appointment. Patient was comfortable with this plan.

## 2020-07-20 NOTE — Patient Instructions (Addendum)
Continue weighing daily and call for an overnight weight gain of > 2 pounds or a weekly weight gain of >5 pounds.   Call in the future if you'd like to schedule another appointment.

## 2020-08-10 ENCOUNTER — Ambulatory Visit
Admission: RE | Admit: 2020-08-10 | Discharge: 2020-08-10 | Disposition: A | Discharge status: Home / Self Care | Payer: Medicare Other | Source: Ambulatory Visit | Attending: Family Medicine | Admitting: Family Medicine

## 2020-08-10 DIAGNOSIS — Z1231 Encounter for screening mammogram for malignant neoplasm of breast: Secondary | ICD-10-CM | POA: Diagnosis present

## 2021-01-07 ENCOUNTER — Other Ambulatory Visit: Payer: Self-pay | Admitting: Family

## 2021-02-25 IMAGING — MG DIGITAL SCREENING UNILAT LEFT W/ TOMO W/ CAD
8 series · 8 of 24 positions shown · non-contrast
Comparison: Previous exam(s).

ACR Breast Density Category a: The breast tissue is almost entirely
fatty.

CLINICAL DATA: Screening.

EXAM:
DIGITAL SCREENING UNILATERAL LEFT MAMMOGRAM WITH CAD AND TOMO

[L CC synth-2D (1 of 2)]
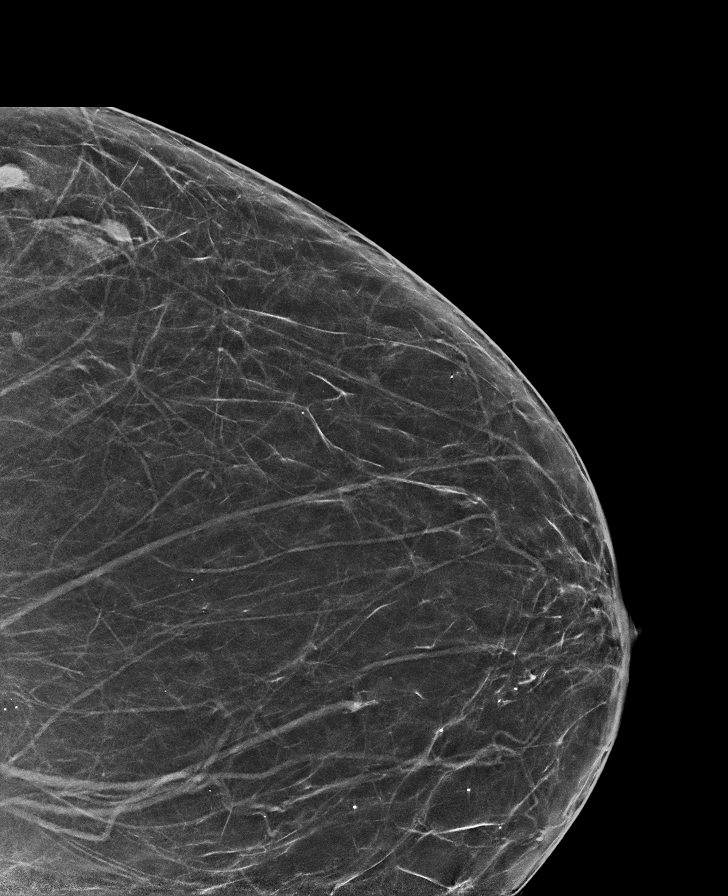

[L MLO synth-2D (1 of 2)]
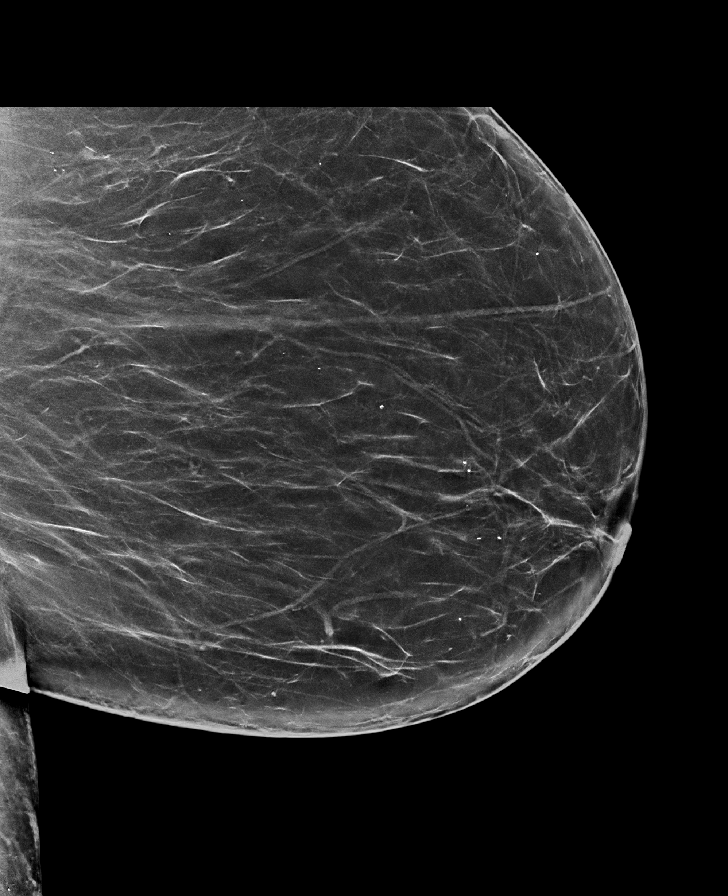

[L MLO synth-2D (2 of 2)]
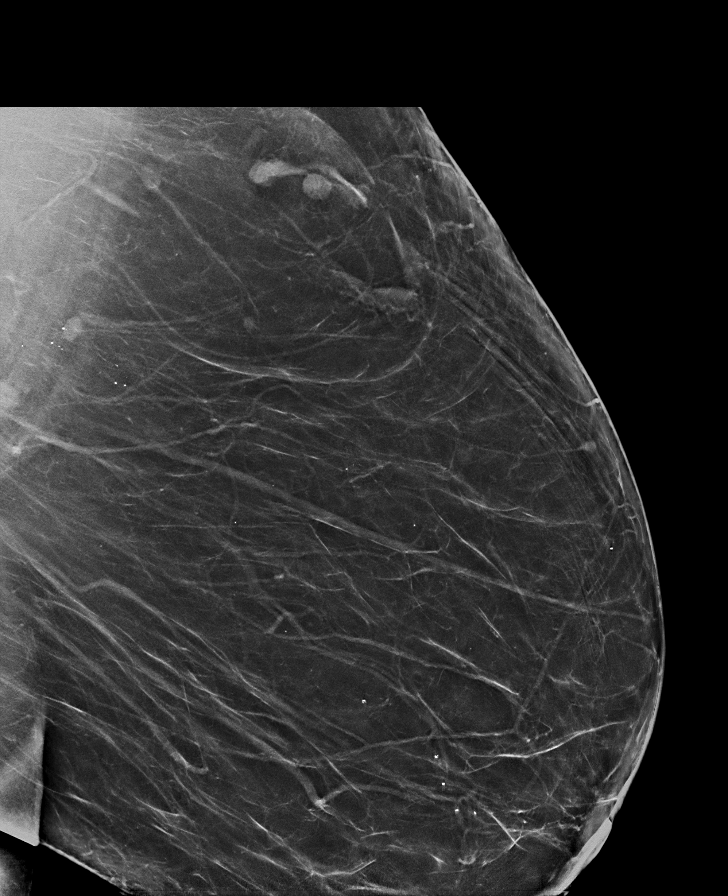

[L CC synth-2D (2 of 2)]
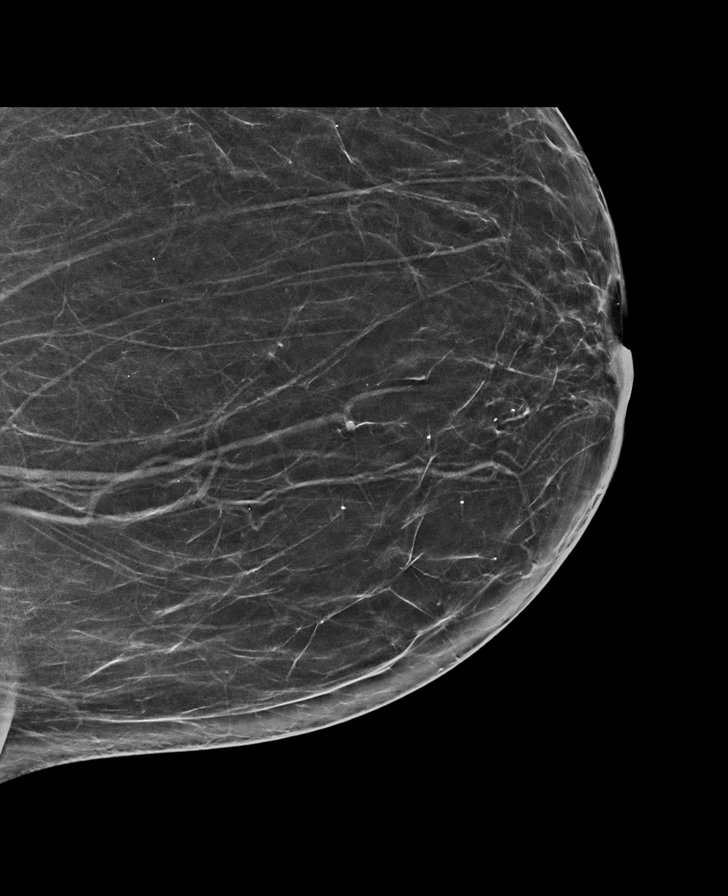

[L MLO tomo (1 of 2) · tomo slice 45/88.0]
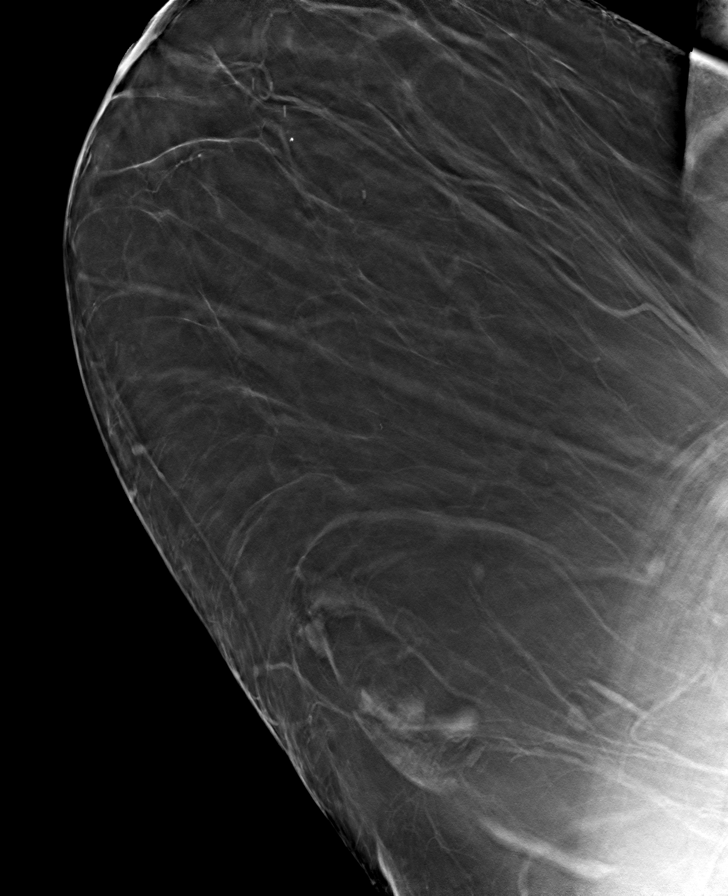

[L MLO tomo (2 of 2) · tomo slice 41/81.0]
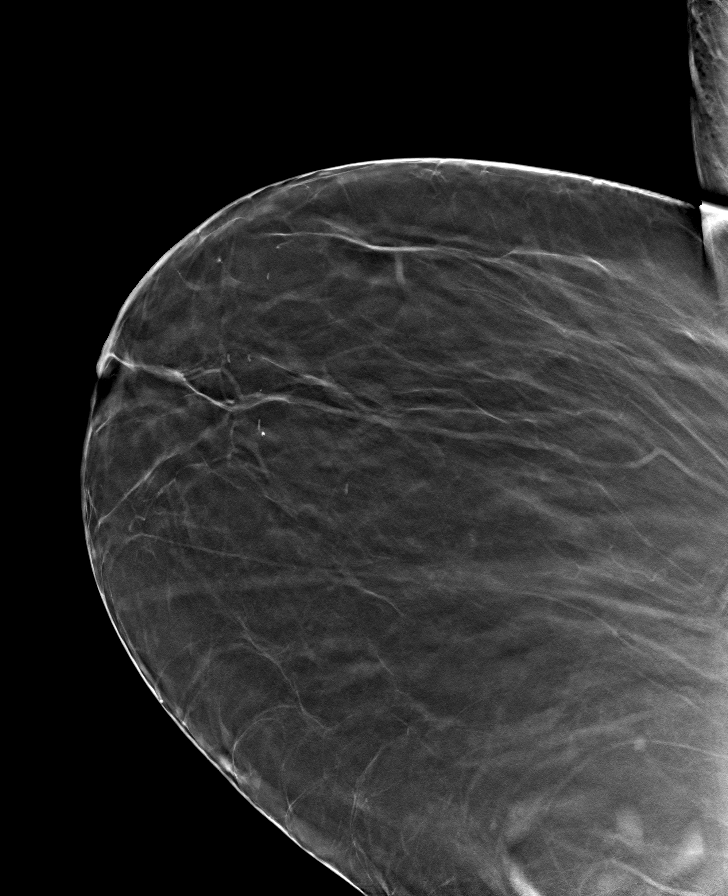

[L CC tomo (1 of 2) · tomo slice 36/71.0]
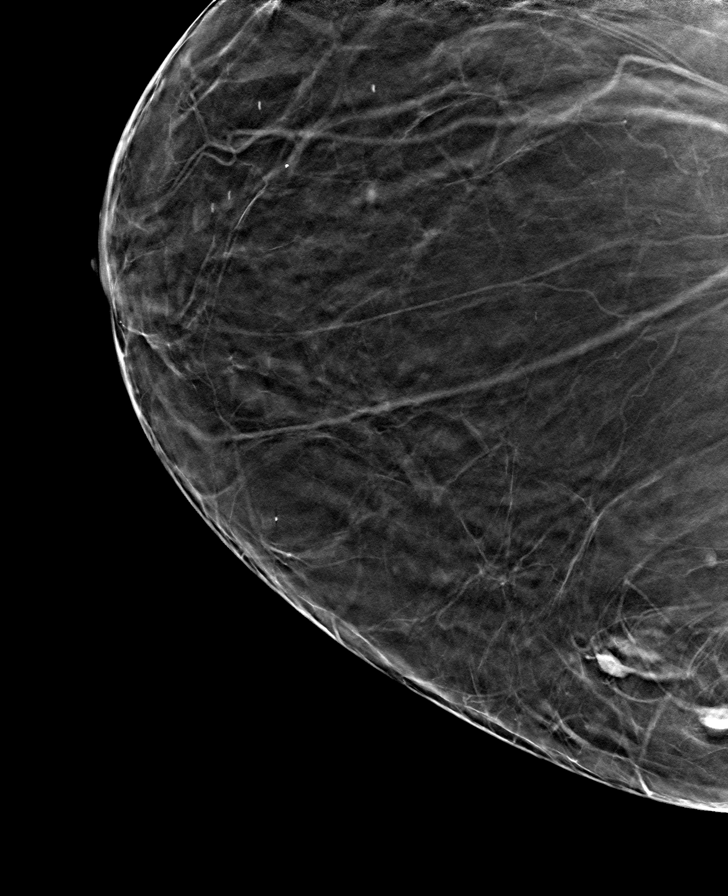

[L CC tomo (2 of 2) · tomo slice 35/68.0]
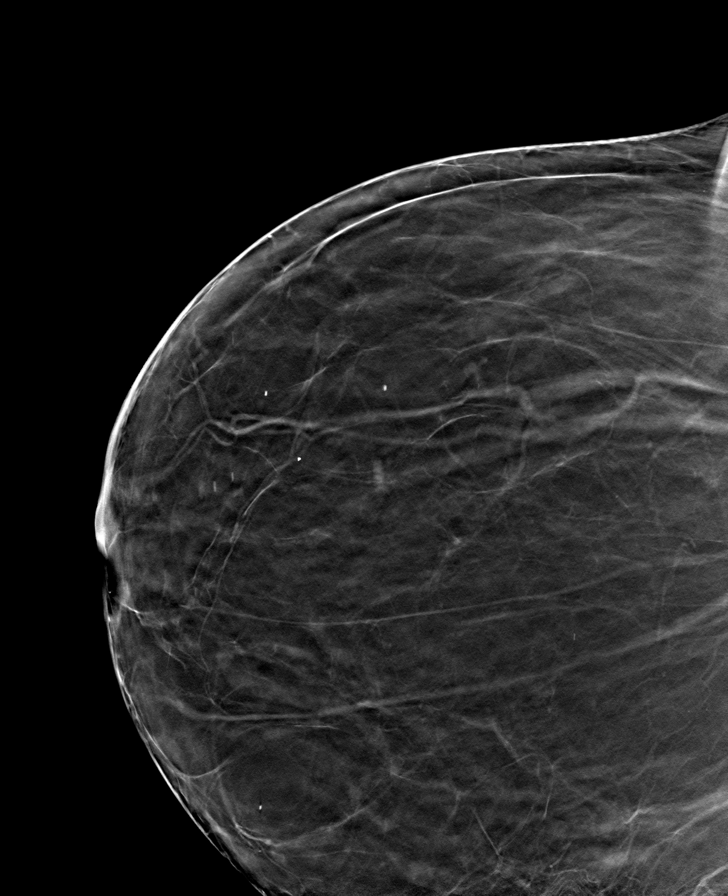

[8 of 24 positions shown; findings below may reference images not displayed]

FINDINGS: The patient has had a right mastectomy. There are no findings
suspicious for malignancy.

Images were processed with CAD.
IMPRESSION: No mammographic evidence of malignancy. A result letter of this
screening mammogram will be mailed directly to the patient.

RECOMMENDATION:
Screening mammogram in one year.  (Code:[id number])

BI-RADS CATEGORY  1: Negative.

## 2021-03-27 ENCOUNTER — Other Ambulatory Visit: Payer: Self-pay | Admitting: Family

## 2021-04-11 ENCOUNTER — Ambulatory Visit: Payer: Medicare Other | Admitting: Family

## 2021-04-14 ENCOUNTER — Other Ambulatory Visit: Payer: Self-pay

## 2021-04-14 ENCOUNTER — Ambulatory Visit: Payer: Medicare Other | Attending: Family | Admitting: Family

## 2021-04-14 ENCOUNTER — Encounter: Payer: Self-pay | Admitting: Family

## 2021-04-14 VITALS — BP 121/70 | HR 81 | Resp 20 | Ht 61.0 in | Wt 267.1 lb

## 2021-04-14 DIAGNOSIS — Z882 Allergy status to sulfonamides status: Secondary | ICD-10-CM | POA: Diagnosis not present

## 2021-04-14 DIAGNOSIS — Z7982 Long term (current) use of aspirin: Secondary | ICD-10-CM | POA: Diagnosis not present

## 2021-04-14 DIAGNOSIS — Z7984 Long term (current) use of oral hypoglycemic drugs: Secondary | ICD-10-CM | POA: Diagnosis not present

## 2021-04-14 DIAGNOSIS — Z885 Allergy status to narcotic agent status: Secondary | ICD-10-CM | POA: Diagnosis not present

## 2021-04-14 DIAGNOSIS — I5032 Chronic diastolic (congestive) heart failure: Secondary | ICD-10-CM

## 2021-04-14 DIAGNOSIS — I5042 Chronic combined systolic (congestive) and diastolic (congestive) heart failure: Secondary | ICD-10-CM | POA: Diagnosis present

## 2021-04-14 DIAGNOSIS — J449 Chronic obstructive pulmonary disease, unspecified: Secondary | ICD-10-CM | POA: Diagnosis not present

## 2021-04-14 DIAGNOSIS — Z880 Allergy status to penicillin: Secondary | ICD-10-CM | POA: Diagnosis not present

## 2021-04-14 DIAGNOSIS — I959 Hypotension, unspecified: Secondary | ICD-10-CM | POA: Insufficient documentation

## 2021-04-14 DIAGNOSIS — Z8249 Family history of ischemic heart disease and other diseases of the circulatory system: Secondary | ICD-10-CM | POA: Insufficient documentation

## 2021-04-14 DIAGNOSIS — Z79899 Other long term (current) drug therapy: Secondary | ICD-10-CM | POA: Diagnosis not present

## 2021-04-14 DIAGNOSIS — E119 Type 2 diabetes mellitus without complications: Secondary | ICD-10-CM | POA: Diagnosis not present

## 2021-04-14 DIAGNOSIS — I952 Hypotension due to drugs: Secondary | ICD-10-CM

## 2021-04-14 DIAGNOSIS — Z86711 Personal history of pulmonary embolism: Secondary | ICD-10-CM | POA: Insufficient documentation

## 2021-04-14 DIAGNOSIS — I110 Hypertensive heart disease with heart failure: Secondary | ICD-10-CM | POA: Insufficient documentation

## 2021-04-14 NOTE — Progress Notes (Signed)
Melissa Harrell - PHARMACIST COUNSELING NOTE  ADHERENCE ASSESSMENT   Do you ever forget to take your medication? [] Yes (1) [x] No (0)  Do you ever skip doses due to side effects? [] Yes (1) [x] No (0)  Do you have trouble affording your medicines? [] Yes (1) [x] No (0)  Are you ever unable to pick up your medication due to transportation difficulties? [] Yes (1) [x] No (0)  Do you ever stop taking your medications because you don't believe they are helping? [] Yes (1) [x] No (0)  Total score _0______     Guideline-Directed Medical Therapy/Evidence Based Medicine  ACE/ARB/ARNI: N/A Beta Blocker: carvedilol Aldosterone Antagonist: spironolactone Diuretic: furosemide    SUBJECTIVE  HPI:  Past Medical History:  Diagnosis Date  . Anxiety   . Arthritis   . Asthma   . Breast cancer ([place]) [id number]   right   . CHF (congestive heart failure) ([place])   . COPD (chronic obstructive pulmonary disease) ([place])   . Depression   . Diabetes mellitus without complication ([place])   . GERD (gastroesophageal reflux disease)   . Hyperlipidemia   . Hypertension   . Personal history of chemotherapy   . Personal history of radiation therapy   . Pulmonary emboli ([facility])         OBJECTIVE   Vital signs: HR 81, BP 121/70, weight (pounds) 267 ECHO: Date 02/11/2017, EF 50%, notes no LVH, LA normal in size   BMP Latest Ref Rng & Units 08/02/2015 07/27/2014 07/02/2014  Glucose 65 - 99 mg/dL 163(H) 161(H) 239(H)  BUN 6 - 20 mg/dL 22(H) 14 18  Creatinine 0.44 - 1.00 mg/dL 1.03(H) 1.05 1.07  Sodium 135 - 145 mmol/L 136 141 138  Potassium 3.5 - 5.1 mmol/L 4.2 4.1 4.3  Chloride 101 - 111 mmol/L 104 103 103  CO2 22 - 32 mmol/L 28 30 27   Calcium 8.9 - 10.3 mg/dL 8.4(L) 9.0 8.2(L)    ASSESSMENT 60 yo F presenting to heart failure clinic for follow-up visit. PMH includes PE, breast cancer, HTN, HLD, GERD, diabetes, depression, COPD, anxiety, asthma, and arthritis. No  barriers to adherence were identified during medication reconciliation. Medication list was reviewed with patient.    PLAN CHF/HTN - Continue carvedilol 12.5 mg twice daily  - Continue Entresto 25 mg daily  - Continue furosemide 20 mg daily  - Continue spironolactone 25 mg daily  - Pt unable to tolerate empagliflozin due to frequent yeast infections - Schedule for ECHO  Diabetes/HLD - Continue glipizide 5 mg daily  - Continue metformin 1000 mg twice daily - Continue simvastatin 40 mg daily    Asthma/COPD - Continue albuterol inhaler use as needed  General Health - Continue biotin 500 mcg daily - Continue calcium and vitamin D supplement daily - Continue magnesium oxide supplement daily   - Continue multivitamin daily - Continue omega 3 supplement daily - Continue vitamin C supplement daily   Depression - Continue citalopram 20 mg daily   Allergies - Continue Flonase nasal spray daily  - Continue loratadine 10 mg daily   Pain - Continue gabapentin 300 mg daily at bedtime  GERD - Continue omeprazole 20 mg daily   Urinary incontinence - continue oxybutynin 5 mg twice daily   Sleep - Continue trazodone 50 mg at bedtime  Time spent: 15 minutes  Benn Moulder, PharmD Pharmacy Resident  04/14/2021 12:10 PM   Current Outpatient Medications:  .  albuterol (PROVENTIL HFA;VENTOLIN HFA) 108 (90 BASE) MCG/ACT inhaler, Inhale 2 puffs  into the lungs every 6 (six) hours as needed for wheezing or shortness of breath., Disp: , Rfl:  .  aspirin 81 MG tablet, Take 81 mg by mouth daily., Disp: , Rfl:  .  BIOTIN PO, Take 500 mcg by mouth daily., Disp: , Rfl:  .  Calcium Carb-Cholecalciferol 600-200 MG-UNIT TABS, Take 1 tablet by mouth 2 (two) times daily., Disp: , Rfl:  .  carvedilol (COREG) 12.5 MG tablet, Take 1 tablet (12.5 mg total) by mouth 2 (two) times daily with a meal. MUST KEEP HF appt, Disp: 60 tablet, Rfl: 0 .  citalopram (CELEXA) 20 MG tablet, Take 20 mg by  mouth daily., Disp: , Rfl:  .  diclofenac (VOLTAREN) 75 MG EC tablet, Take 75 mg by mouth daily., Disp: , Rfl:  .  empagliflozin (JARDIANCE) 25 MG TABS tablet, Take 25 mg by mouth daily., Disp: , Rfl:  .  ENTRESTO 24-26 MG, TAKE 1 TABLET BY MOUTH TWICE A DAY, Disp: 180 tablet, Rfl: 0 .  fluticasone (FLONASE) 50 MCG/ACT nasal spray, Place 2 sprays into both nostrils daily., Disp: , Rfl:  .  fluticasone (FLOVENT HFA) 110 MCG/ACT inhaler, Inhale 2 puffs into the lungs 2 (two) times daily., Disp: , Rfl:  .  furosemide (LASIX) 20 MG tablet, Take 20 mg by mouth daily., Disp: , Rfl:  .  gabapentin (NEURONTIN) 300 MG capsule, Take 300 mg by mouth at bedtime., Disp: , Rfl:  .  loratadine (CLARITIN) 10 MG tablet, Take 10 mg by mouth daily., Disp: , Rfl:  .  magnesium oxide (MAG-OX) 400 MG tablet, Take 400 mg daily by mouth., Disp: , Rfl:  .  meloxicam (MOBIC) 15 MG tablet, Take 15 mg by mouth daily., Disp: , Rfl:  .  metFORMIN (GLUCOPHAGE) 500 MG tablet, Take 1,000 mg by mouth 2 (two) times daily with a meal. , Disp: , Rfl:  .  Multiple Vitamin (MULTIVITAMIN) capsule, Take 1 capsule daily by mouth., Disp: , Rfl:  .  Omega 3 1000 MG CAPS, Take 1 capsule by mouth daily., Disp: , Rfl:  .  omeprazole (PRILOSEC) 20 MG capsule, Take 20 mg by mouth 2 (two) times daily before a meal., Disp: , Rfl:  .  oxybutynin (DITROPAN) 5 MG tablet, Take 5 mg by mouth 2 (two) times daily., Disp: , Rfl:  .  simvastatin (ZOCOR) 40 MG tablet, Take 40 mg by mouth daily., Disp: , Rfl:  .  spironolactone (ALDACTONE) 25 MG tablet, Take 25 mg by mouth daily., Disp: , Rfl:  .  tiZANidine (ZANAFLEX) 4 MG capsule, Take 4 mg by mouth 3 (three) times daily., Disp: , Rfl:  .  traZODone (DESYREL) 50 MG tablet, Take 50 mg by mouth at bedtime., Disp: , Rfl:  .  vitamin C (ASCORBIC ACID) 500 MG tablet, Take 500 mg by mouth daily., Disp: , Rfl:    COUNSELING POINTS/CLINICAL PEARLS  Carvedilol (Goal: weight less than 85 kg is 25 mg BID, weight  greater than 85 kg is 50 mg BID)  Patient should avoid activities requiring coordination until drug effects are realized, as drug may cause dizziness.  This drug may cause diarrhea, nausea, vomiting, arthralgia, back pain, myalgia, headache, vision disorder, erectile dysfunction, reduced libido, or fatigue.  Instruct patient to report signs/symptoms of adverse cardiovascular effects such as hypotension (especially in elderly patients), arrhythmias, syncope, palpitations, angina, or edema.  Drug may mask symptoms of hypoglycemia. Advise diabetic patients to carefully monitor blood sugar levels.  Patient should take drug  with food.  Advise patient against sudden discontinuation of drug. Entresto (Goal: 97/103 mg twice daily)  Warn female patient to avoid pregnancy during therapy and to report a pregnancy to a physician.  Advise patient to report symptomatic hypotension.  Side effects may include hyperkalemia, cough, dizziness, or renal failure. Furosemide  Drug causes sun-sensitivity. Advise patient to use sunscreen and avoid tanning beds. Patient should avoid activities requiring coordination until drug effects are realized, as drug may cause dizziness, vertigo, or blurred vision. This drug may cause hyperglycemia, hyperuricemia, constipation, diarrhea, loss of appetite, nausea, vomiting, purpuric disorder, cramps, spasticity, asthenia, headache, paresthesia, or scaling eczema. Instruct patient to report unusual bleeding/bruising or signs/symptoms of hypotension, infection, pancreatitis, or ototoxicity (tinnitus, hearing impairment). Advise patient to report signs/symptoms of a severe skin reactions (flu-like symptoms, spreading red rash, or skin/mucous membrane blistering) or erythema multiforme. Instruct patient to eat high-potassium foods during drug therapy, as directed by healthcare professional.  Patient should not drink alcohol while taking this drug. Spironolactone  Warn patient to report  dehydration, hypotension, or symptoms of worsening renal function.  Counsel female patient to report gynecomastia.  Side effects may include diarrhea, nausea, vomiting, abdominal cramping, fever, leg cramps, lethargy, mental confusion, decreased libido, irregular menses, and rash. Suspension: Tell patient to take drug consistently with respect to food, either before or after a meal.  Advise patient to avoid potassium supplements and foods containing high levels of potassium, including salt substitutes.  DRUGS TO AVOID IN HEART FAILURE  Drug or Class Mechanism  Analgesics . NSAIDs . COX-2 inhibitors . Glucocorticoids  Sodium and water retention, increased systemic vascular resistance, decreased response to diuretics   Diabetes Medications . Metformin . Thiazolidinediones o Rosiglitazone (Avandia) o Pioglitazone (Actos) . DPP4 Inhibitors o Saxagliptin (Onglyza) o Sitagliptin (Januvia)   Lactic acidosis Possible calcium channel blockade   Unknown  Antiarrhythmics . Class I  o Flecainide o Disopyramide . Class III o Sotalol . Other o Dronedarone  Negative inotrope, proarrhythmic   Proarrhythmic, beta blockade  Negative inotrope  Antihypertensives . Alpha Blockers o Doxazosin . Calcium Channel Blockers o Diltiazem o Verapamil o Nifedipine . Central Alpha Adrenergics o Moxonidine . Peripheral Vasodilators o Minoxidil  Increases renin and aldosterone  Negative inotrope    Possible sympathetic withdrawal  Unknown  Anti-infective . Itraconazole . Amphotericin B  Negative inotrope Unknown  Hematologic . Anagrelide . Cilostazol   Possible inhibition of PD IV Inhibition of PD III causing arrhythmias  Neurologic/Psychiatric . Stimulants . Anti-Seizure  Drugs o Carbamazepine o Pregabalin . Antidepressants o Tricyclics o Citalopram . Parkinsons o Bromocriptine o Pergolide o Pramipexole . Antipsychotics o Clozapine . Antimigraine o Ergotamine o Methysergide . Appetite suppressants . Bipolar o Lithium  Peripheral alpha and beta agonist activity  Negative inotrope and chronotrope Calcium channel blockade  Negative inotrope, proarrhythmic Dose-dependent QT prolongation  Excessive serotonin activity/valvular damage Excessive serotonin activity/valvular damage Unknown  IgE mediated hypersensitivy, calcium channel blockade  Excessive serotonin activity/valvular damage Excessive serotonin activity/valvular damage Valvular damage  Direct myofibrillar degeneration, adrenergic stimulation  Antimalarials . Chloroquine . Hydroxychloroquine Intracellular inhibition of lysosomal enzymes  Urologic Agents . Alpha Blockers o Doxazosin o Prazosin o Tamsulosin o Terazosin  Increased renin and aldosterone  Adapted from Page RL, et al. "Drugs That May Cause or Exacerbate Heart Failure: A Scientific Statement from the [place]." Circulation 2016; [id number]. DOI: 10.1161/CIR.0000000000000426   MEDICATION ADHERENCES TIPS AND STRATEGIES 1. Taking medication as prescribed improves patient outcomes in heart failure (reduces hospitalizations,  improves symptoms, increases survival) 2. Side effects of medications can be managed by decreasing doses, switching agents, stopping drugs, or adding additional therapy. Please let someone in the [facility] know if you have having bothersome side effects so we can modify your regimen. Do not alter your medication regimen without talking to [place].  3. Medication reminders can help patients remember to take drugs on time. If you are missing or forgetting doses you can try linking behaviors, using pill boxes, or an electronic reminder like an alarm on your phone or an app. Some  people can also get automated phone calls as medication reminders.

## 2021-04-14 NOTE — Patient Instructions (Addendum)
Continue weighing daily and call for an overnight weight gain of > 2 pounds or a weekly weight gain of >5 pounds.   Bring ALL medications including any over the counter or vitamins to every visit.

## 2021-04-14 NOTE — Progress Notes (Signed)
Patient ID: Melissa Harrell, female    DOB: 1961/05/03, 60 y.o.   MRN: [id number]  HPI  Melissa Harrell is a 60 year old female with a history of morbid obesity, COPD, diabetes, HTN, pulmonary emboli and systolic heart failure who returns today for a f/u visit.   Last echo done 02/08/17 which showed an EF of 50% along with mild MR/TR. Previous echo done 03/14/15 with an EF of 45% without any stenosis or regurgitation.   Has not been admitted or in the ED in the last 6 months.   Returns today with a chief complaint of a follow-up visit. She currently doesn't voice any complaints and specifically denies any difficulty sleeping, dizziness, abdominal distention, palpitations, pedal edema, chest pain, shortness of breath, cough, fatigue or weight gain.   Says that she tried to take empagliflozin and it caused yeast infection and she subsequently stopped taking it.   Past Medical History:  Diagnosis Date  . Anxiety   . Arthritis   . Asthma   . Breast cancer ([place]) [id number]   right   . CHF (congestive heart failure) ([place])   . COPD (chronic obstructive pulmonary disease) ([place])   . Depression   . Diabetes mellitus without complication ([place])   . GERD (gastroesophageal reflux disease)   . Hyperlipidemia   . Hypertension   . Personal history of chemotherapy   . Personal history of radiation therapy   . Pulmonary emboli [facility])    Past Surgical History:  Procedure Laterality Date  . ABDOMINAL HYSTERECTOMY    . BREAST BIOPSY Right 2006   positive  . BREAST EXCISIONAL BIOPSY Right 2003   positive, chemo and radiation  . COLONOSCOPY WITH PROPOFOL N/A 12/30/2017   Procedure: COLONOSCOPY WITH PROPOFOL;  Surgeon: Jonathon Bellows, MD;  Location: [facility] ENDOSCOPY;  Service: Gastroenterology;  Laterality: N/A;  . MASTECTOMY Right 2006  . TUBAL LIGATION Bilateral    Family History  Problem Relation Age of Onset  . Cancer - Cervical Maternal Grandmother   . Heart disease Mother   . Heart failure Sister   . Breast  cancer Cousin    Social History   Tobacco Use  . Smoking status: Never Smoker  . Smokeless tobacco: Never Used  Substance Use Topics  . Alcohol use: No    Alcohol/week: 0.0 standard drinks   Allergies  Allergen Reactions  . Bee Venom Swelling  . Sulfa Antibiotics Hives  . Vicodin [Hydrocodone-Acetaminophen] Nausea And Vomiting  . Latex Itching and Rash  . Penicillins Itching   Prior to Admission medications   Medication Sig Start Date End Date Taking? Authorizing Provider  albuterol (PROVENTIL HFA;VENTOLIN HFA) 108 (90 BASE) MCG/ACT inhaler Inhale 2 puffs into the lungs every 6 (six) hours as needed for wheezing or shortness of breath.   Yes [provider]  aspirin 81 MG tablet Take 81 mg by mouth daily.   Yes [provider]  BIOTIN PO Take 500 mcg by mouth daily.   Yes [provider]  Calcium Carb-Cholecalciferol 600-200 MG-UNIT TABS Take 1 tablet by mouth 2 (two) times daily.   Yes [provider]  carvedilol (COREG) 12.5 MG tablet Take 1 tablet (12.5 mg total) by mouth 2 (two) times daily with a meal. MUST KEEP HF appt 03/27/21  Yes Darylene Price A, FNP  citalopram (CELEXA) 20 MG tablet Take 20 mg by mouth daily.   Yes [provider]  ENTRESTO 24-26 MG TAKE 1 TABLET BY MOUTH TWICE A DAY 01/07/21  Yes Darylene Price A, FNP  fluticasone (FLONASE) 50 MCG/ACT nasal spray Place 2 sprays into both nostrils daily.   Yes [provider]  furosemide (LASIX) 20 MG tablet Take 20 mg by mouth daily.   Yes [provider]  gabapentin (NEURONTIN) 300 MG capsule Take 300 mg by mouth at bedtime.   Yes [provider]  glipiZIDE (GLUCOTROL XL) 5 MG 24 hr tablet Take 5 mg by mouth daily with breakfast.   Yes [provider]  loratadine (CLARITIN) 10 MG tablet Take 10 mg by mouth daily.   Yes [provider]  magnesium oxide (MAG-OX) 400 MG tablet Take 400 mg daily by mouth.   Yes [provider]   metFORMIN (GLUCOPHAGE) 500 MG tablet Take 1,000 mg by mouth 2 (two) times daily with a meal.    Yes [provider]  Multiple Vitamin (MULTIVITAMIN) capsule Take 1 capsule daily by mouth.   Yes [provider]  Omega 3 1000 MG CAPS Take 1 capsule by mouth daily.   Yes [provider]  omeprazole (PRILOSEC) 20 MG capsule Take 20 mg by mouth 2 (two) times daily before a meal.   Yes [provider]  oxybutynin (DITROPAN) 5 MG tablet Take 5 mg by mouth 2 (two) times daily.   Yes [provider]  simvastatin (ZOCOR) 40 MG tablet Take 40 mg by mouth daily.   Yes [provider]  spironolactone (ALDACTONE) 25 MG tablet Take 25 mg by mouth daily.   Yes [provider]  traZODone (DESYREL) 50 MG tablet Take 50 mg by mouth at bedtime.   Yes [provider]  vitamin C (ASCORBIC ACID) 500 MG tablet Take 500 mg by mouth daily.   Yes [provider]  empagliflozin (JARDIANCE) 25 MG TABS tablet Take 25 mg by mouth daily. Patient not taking: Reported on 04/14/2021    [provider]  fluticasone (FLOVENT HFA) 110 MCG/ACT inhaler Inhale 2 puffs into the lungs 2 (two) times daily. Patient not taking: Reported on 04/14/2021    [provider]    Review of Systems  Constitutional: Negative for appetite change and fatigue.  HENT: Negative for congestion, postnasal drip and sore throat.   Eyes: Negative.   Respiratory: Negative for cough, chest tightness, shortness of breath and wheezing.   Cardiovascular: Negative for chest pain, palpitations and leg swelling.  Gastrointestinal: Negative for abdominal distention and abdominal pain.  Endocrine: Negative.   Genitourinary: Negative.   Musculoskeletal: Negative for arthralgias and back pain.  Skin: Negative.   Allergic/Immunologic: Negative.   Neurological: Negative for dizziness and light-headedness.  Hematological: Negative for adenopathy. Does not bruise/bleed  easily.  Psychiatric/Behavioral: Negative for dysphoric mood, sleep disturbance and suicidal ideas. The patient is not nervous/anxious.    Vitals:   04/14/21 1125  BP: 121/70  Pulse: 81  Resp: 20  SpO2: 99%  Weight: 267 lb 2 oz (121.2 kg)  Height: 5\' 1"  (1.549 m)   Wt Readings from Last 3 Encounters:  04/14/21 267 lb 2 oz (121.2 kg)  07/20/20 255 lb 8 oz (115.9 kg)  12/15/19 256 lb 12.8 oz (116.5 kg)   Lab Results  Component Value Date   CREATININE 1.03 (H) 08/02/2015   CREATININE 1.05 07/27/2014   CREATININE 1.07 07/02/2014   Physical Exam Vitals and nursing note reviewed.  Constitutional:      Appearance: She is well-developed.  HENT:     Head: Normocephalic and atraumatic.  Neck:  Vascular: No JVD.  Cardiovascular:     Rate and Rhythm: Normal rate and regular rhythm.  Pulmonary:     Effort: Pulmonary effort is normal.     Breath sounds: No wheezing or rales.  Abdominal:     General: There is no distension.     Palpations: Abdomen is soft.     Tenderness: There is no abdominal tenderness.  Musculoskeletal:        General: No tenderness.     Cervical back: Normal range of motion and neck supple.  Skin:    General: Skin is warm and dry.  Neurological:     Mental Status: She is alert and oriented to person, place, and time.  Psychiatric:        Behavior: Behavior normal.        Thought Content: Thought content normal.    Assessment & Plan  1: Chronic heart failure with preserved ejection fraction-  -NYHA Class I today -euvolemic today - weighing daily and she was reminded to call for an overnight weight gain of >2 pounds or weekly weight gain of >5 pounds.  - weight up 12 pounds from last visit here 9 months ago - not adding salt and trying to read food labels - saw cardiologist Nehemiah Massed) 03/24/20 - echo scheduled for 05/16/21 - tried to take empagliflozin which caused yeast infections; could titrate up entresto if needed for BP control although has a  history of hypotension - PharmD reconciled medications with the patient  2: Hypotension- - BP looks good today - saw (Aycock) either Jan/ Feb of this year - BMP 09/22/18 reviewed and showed sodium 136, potassium 4.2, creatinine 1.03 and GFR >60; have requested updated lab work from PCP  3: Diabetes- - PCP follows her for her diabetes - requesting updated lab work from PCP   Patient did not bring her medications nor a list. Each medication was verbally reviewed with the patient and she was encouraged to bring the bottles to every visit to confirm accuracy of list..  Return in 6 weeks or sooner for any questions/problems before then.

## 2021-04-26 ENCOUNTER — Other Ambulatory Visit: Payer: Self-pay | Admitting: Family

## 2021-05-16 ENCOUNTER — Ambulatory Visit
Admission: RE | Admit: 2021-05-16 | Discharge: 2021-05-16 | Disposition: A | Discharge status: Home / Self Care | Payer: Medicare Other | Source: Ambulatory Visit | Attending: Family | Admitting: Family

## 2021-05-16 ENCOUNTER — Other Ambulatory Visit: Payer: Self-pay

## 2021-05-16 DIAGNOSIS — E119 Type 2 diabetes mellitus without complications: Secondary | ICD-10-CM | POA: Diagnosis not present

## 2021-05-16 DIAGNOSIS — I5032 Chronic diastolic (congestive) heart failure: Secondary | ICD-10-CM

## 2021-05-16 DIAGNOSIS — F419 Anxiety disorder, unspecified: Secondary | ICD-10-CM | POA: Diagnosis not present

## 2021-05-16 LAB — ECHOCARDIOGRAM COMPLETE
AR max vel: 2.72 cm2
AV Area VTI: 2.89 cm2
AV Area mean vel: 2.56 cm2
AV Mean grad: 3 mmHg
AV Peak grad: 5.1 mmHg
Ao pk vel: 1.13 m/s
Area-P 1/2: 6.9 cm2
S' Lateral: 2.8 cm

## 2021-05-16 NOTE — Progress Notes (Signed)
*  PRELIMINARY RESULTS* Echocardiogram 2D Echocardiogram has been performed.  Sherrie Sport 05/16/2021, 11:28 AM

## 2021-05-30 NOTE — Progress Notes (Signed)
Patient ID: Melissa Harrell, female    DOB: [birth year], 60 y.o.   MRN: [id number]  HPI  Ms Melissa Harrell is a 60 year old female with a history of morbid obesity, COPD, diabetes, HTN, pulmonary emboli and systolic heart failure who returns today for a f/u visit.   Echo report from 05/16/21 reviewed and showed an EF of 65-70% along with moderate LVH and mild MR. Last echo done 02/08/17 which showed an EF of 50% along with mild MR/TR. Previous echo done 03/14/15 with an EF of 45% without any stenosis or regurgitation.   Has not been admitted or in the ED in the last 6 months.   Returns today for a follow-up visit with a chief complaint of intermittent right shoulder pain. She says that this started ~ 3 days ago and she doesn't recall any injury, fall or heavy lifting. She is wondering if she slept wrong on it. Has used an OTC patch which has helped some. She has no other symptoms and specifically denies any difficulty sleeping, dizziness, abdominal distention, palpitations, pedal edema, chest pain, shortness of breath, cough, fatigue or weight gain.   Medications are now getting pill packed through her pharmacy and she says that has helped her out greatly. Says that she previously tried to take empagliflozin and it caused yeast infection and she subsequently stopped taking it.   Past Medical History:  Diagnosis Date   Anxiety    Arthritis    Asthma    Breast cancer ([place]) [phone]   right    CHF (congestive heart failure) ([facility])    COPD (chronic obstructive pulmonary disease) ([facility])    Depression    Diabetes mellitus without complication ([facility])    GERD (gastroesophageal reflux disease)    Hyperlipidemia    Hypertension    Personal history of chemotherapy    Personal history of radiation therapy    Pulmonary emboli [facility])    Past Surgical History:  Procedure Laterality Date   ABDOMINAL HYSTERECTOMY     BREAST BIOPSY Right 2006   positive   BREAST EXCISIONAL BIOPSY Right 2003   positive, chemo and  radiation   COLONOSCOPY WITH PROPOFOL N/A 12/30/2017   Procedure: COLONOSCOPY WITH PROPOFOL;  Surgeon: Jonathon Bellows, MD;  Location: [facility] ENDOSCOPY;  Service: Gastroenterology;  Laterality: N/A;   MASTECTOMY Right 2006   TUBAL LIGATION Bilateral    Family History  Problem Relation Age of Onset   Cancer - Cervical Maternal Grandmother    Heart disease Mother    Heart failure Sister    Breast cancer Cousin    Social History   Tobacco Use   Smoking status: Never   Smokeless tobacco: Never  Substance Use Topics   Alcohol use: No    Alcohol/week: 0.0 standard drinks   Allergies  Allergen Reactions   Bee Venom Swelling   Sulfa Antibiotics Hives   Vicodin [Hydrocodone-Acetaminophen] Nausea And Vomiting   Latex Itching and Rash   Penicillins Itching   Prior to Admission medications   Medication Sig Start Date End Date Taking? Authorizing Provider  albuterol (PROVENTIL HFA;VENTOLIN HFA) 108 (90 BASE) MCG/ACT inhaler Inhale 2 puffs into the lungs every 6 (six) hours as needed for wheezing or shortness of breath.   Yes [provider]  aspirin 81 MG tablet Take 81 mg by mouth daily.   Yes [provider]  BIOTIN PO Take 500 mcg by mouth daily.   Yes [provider]  Calcium Carb-Cholecalciferol 600-200 MG-UNIT TABS Take 1  tablet by mouth 2 (two) times daily.   Yes [provider]  carvedilol (COREG) 12.5 MG tablet Take 1 tablet (12.5 mg total) by mouth 2 (two) times daily with a meal. 04/26/21  Yes Jacquette Canales A, FNP  citalopram (CELEXA) 20 MG tablet Take 20 mg by mouth daily.   Yes [provider]  ENTRESTO 24-26 MG TAKE 1 TABLET BY MOUTH TWICE A DAY 01/07/21  Yes Ancelmo Hunt A, FNP  fluticasone (FLONASE) 50 MCG/ACT nasal spray Place 2 sprays into both nostrils daily.   Yes [provider]  fluticasone (FLOVENT HFA) 110 MCG/ACT inhaler Inhale 2 puffs into the lungs 2 (two) times daily.   Yes [provider]  furosemide  (LASIX) 20 MG tablet Take 20 mg by mouth daily.   Yes [provider]  gabapentin (NEURONTIN) 300 MG capsule Take 300 mg by mouth at bedtime.   Yes [provider]  glipiZIDE (GLUCOTROL XL) 5 MG 24 hr tablet Take 5 mg by mouth daily with breakfast.   Yes [provider]  loratadine (CLARITIN) 10 MG tablet Take 10 mg by mouth daily.   Yes [provider]  magnesium oxide (MAG-OX) 400 MG tablet Take 400 mg daily by mouth.   Yes [provider]  metFORMIN (GLUCOPHAGE) 500 MG tablet Take 1,000 mg by mouth 2 (two) times daily with a meal.    Yes [provider]  Multiple Vitamin (MULTIVITAMIN) capsule Take 1 capsule daily by mouth.   Yes [provider]  Omega 3 1000 MG CAPS Take 1 capsule by mouth daily.   Yes [provider]  omeprazole (PRILOSEC) 20 MG capsule Take 20 mg by mouth 2 (two) times daily before a meal.   Yes [provider]  oxybutynin (DITROPAN) 5 MG tablet Take 5 mg by mouth 2 (two) times daily.   Yes [provider]  simvastatin (ZOCOR) 40 MG tablet Take 40 mg by mouth daily.   Yes [provider]  spironolactone (ALDACTONE) 25 MG tablet Take 25 mg by mouth daily.   Yes [provider]  traZODone (DESYREL) 50 MG tablet Take 50 mg by mouth at bedtime.   Yes [provider]  vitamin C (ASCORBIC ACID) 500 MG tablet Take 500 mg by mouth daily.   Yes [provider]     Review of Systems  Constitutional:  Negative for appetite change and fatigue.  HENT:  Negative for congestion, postnasal drip and sore throat.   Eyes: Negative.   Respiratory:  Negative for cough, chest tightness and shortness of breath.   Cardiovascular:  Negative for chest pain, palpitations and leg swelling.  Gastrointestinal:  Negative for abdominal distention and abdominal pain.  Endocrine: Negative.   Genitourinary: Negative.   Musculoskeletal:  Positive for arthralgias (right shoulder).   Skin: Negative.   Allergic/Immunologic: Negative.   Neurological:  Negative for dizziness and light-headedness.  Hematological:  Negative for adenopathy. Does not bruise/bleed easily.  Psychiatric/Behavioral:  Negative for dysphoric mood and sleep disturbance (sleeping on 1 pillow). The patient is not nervous/anxious.    Vitals:   05/31/21 1141  BP: 117/64  Pulse: 74  Resp: 18  SpO2: 100%  Weight: 271 lb (122.9 kg)  Height: 5\' 1"  (1.549 m)   Wt Readings from Last 3 Encounters:  05/31/21 271 lb (122.9 kg)  04/14/21 267 lb 2 oz (121.2 kg)  07/20/20 255 lb 8 oz (115.9 kg)   Lab Results  Component Value Date   CREATININE  1.03 (H) 08/02/2015   CREATININE 1.05 07/27/2014   CREATININE 1.07 07/02/2014    Physical Exam Vitals reviewed.  Constitutional:      Appearance: Normal appearance.  HENT:     Head: Normocephalic and atraumatic.  Cardiovascular:     Rate and Rhythm: Normal rate and regular rhythm.  Pulmonary:     Effort: Pulmonary effort is normal. No respiratory distress.     Breath sounds: No wheezing or rales.  Abdominal:     General: There is no distension.     Palpations: Abdomen is soft.     Tenderness: There is no abdominal tenderness.  Musculoskeletal:        General: Tenderness (top of right shoulder; good ROM) present.     Cervical back: Normal range of motion and neck supple.     Right lower leg: No edema.     Left lower leg: No edema.  Skin:    General: Skin is warm and dry.  Neurological:     General: No focal deficit present.     Mental Status: She is alert and oriented to person, place, and time.  Psychiatric:        Mood and Affect: Mood normal.        Behavior: Behavior normal.        Thought Content: Thought content normal.    Assessment & Plan  1: Chronic heart failure with preserved ejection fraction-  -NYHA Class I today -euvolemic today - weighing daily and she was reminded to call for an overnight weight gain of >2 pounds or weekly  weight gain of >5 pounds.  - weight down 5 pounds from last visit here 6 weeks ago - not adding salt and trying to read food labels - saw cardiologist Nehemiah Massed) 03/24/20 - on GDMT of entresto - tried to take empagliflozin which caused yeast infections  2: Hypotension- - BP looks good today - saw (Aycock) either Jan/ Feb of this year - BMP 09/22/18 reviewed and showed sodium 136, potassium 4.2, creatinine 1.03 and GFR >60; have requested updated lab work from PCP  3: Diabetes- - PCP follows her for her diabetes - not checking her glucose at home  4: Right shoulder pain- - good ROM without increase in pain - tenderness to palpation over the top of the shoulder - encouraged her to use heat or icy hot patch but to not use both together - follow up with PCP should pain continue   Patient did not bring her medications nor a list. Each medication was verbally reviewed with the patient and she was encouraged to bring the bottles to every visit to confirm accuracy of list..  Return in 6 months or sooner for any questions/problems before then.

## 2021-05-31 ENCOUNTER — Other Ambulatory Visit: Payer: Self-pay

## 2021-05-31 ENCOUNTER — Ambulatory Visit: Payer: Medicare Other | Attending: Family | Admitting: Family

## 2021-05-31 ENCOUNTER — Encounter: Payer: Self-pay | Admitting: Family

## 2021-05-31 VITALS — BP 117/64 | HR 74 | Resp 18 | Ht 61.0 in | Wt 271.0 lb

## 2021-05-31 DIAGNOSIS — Z7951 Long term (current) use of inhaled steroids: Secondary | ICD-10-CM | POA: Insufficient documentation

## 2021-05-31 DIAGNOSIS — Z7982 Long term (current) use of aspirin: Secondary | ICD-10-CM | POA: Diagnosis not present

## 2021-05-31 DIAGNOSIS — Z880 Allergy status to penicillin: Secondary | ICD-10-CM | POA: Insufficient documentation

## 2021-05-31 DIAGNOSIS — Z885 Allergy status to narcotic agent status: Secondary | ICD-10-CM | POA: Insufficient documentation

## 2021-05-31 DIAGNOSIS — J449 Chronic obstructive pulmonary disease, unspecified: Secondary | ICD-10-CM | POA: Insufficient documentation

## 2021-05-31 DIAGNOSIS — I340 Nonrheumatic mitral (valve) insufficiency: Secondary | ICD-10-CM | POA: Insufficient documentation

## 2021-05-31 DIAGNOSIS — I110 Hypertensive heart disease with heart failure: Secondary | ICD-10-CM | POA: Diagnosis not present

## 2021-05-31 DIAGNOSIS — I952 Hypotension due to drugs: Secondary | ICD-10-CM

## 2021-05-31 DIAGNOSIS — I5032 Chronic diastolic (congestive) heart failure: Secondary | ICD-10-CM

## 2021-05-31 DIAGNOSIS — Z8249 Family history of ischemic heart disease and other diseases of the circulatory system: Secondary | ICD-10-CM | POA: Diagnosis not present

## 2021-05-31 DIAGNOSIS — Z86711 Personal history of pulmonary embolism: Secondary | ICD-10-CM | POA: Diagnosis not present

## 2021-05-31 DIAGNOSIS — Z853 Personal history of malignant neoplasm of breast: Secondary | ICD-10-CM | POA: Insufficient documentation

## 2021-05-31 DIAGNOSIS — Z91040 Latex allergy status: Secondary | ICD-10-CM | POA: Diagnosis not present

## 2021-05-31 DIAGNOSIS — M25511 Pain in right shoulder: Secondary | ICD-10-CM | POA: Insufficient documentation

## 2021-05-31 DIAGNOSIS — I5042 Chronic combined systolic (congestive) and diastolic (congestive) heart failure: Secondary | ICD-10-CM | POA: Insufficient documentation

## 2021-05-31 DIAGNOSIS — Z79899 Other long term (current) drug therapy: Secondary | ICD-10-CM | POA: Insufficient documentation

## 2021-05-31 DIAGNOSIS — I959 Hypotension, unspecified: Secondary | ICD-10-CM | POA: Diagnosis not present

## 2021-05-31 DIAGNOSIS — E119 Type 2 diabetes mellitus without complications: Secondary | ICD-10-CM | POA: Insufficient documentation

## 2021-05-31 DIAGNOSIS — Z882 Allergy status to sulfonamides status: Secondary | ICD-10-CM | POA: Diagnosis not present

## 2021-05-31 DIAGNOSIS — Z9011 Acquired absence of right breast and nipple: Secondary | ICD-10-CM | POA: Diagnosis not present

## 2021-05-31 DIAGNOSIS — Z7984 Long term (current) use of oral hypoglycemic drugs: Secondary | ICD-10-CM | POA: Diagnosis not present

## 2021-05-31 NOTE — Patient Instructions (Signed)
Continue weighing daily and call for an overnight weight gain of > 2 pounds or a weekly weight gain of >5 pounds. 

## 2021-06-06 ENCOUNTER — Other Ambulatory Visit: Payer: Self-pay

## 2021-06-06 ENCOUNTER — Emergency Department
Admission: EM | Admit: 2021-06-06 | Discharge: 2021-06-06 | Disposition: A | Discharge status: Home / Self Care | Payer: Medicare Other | Attending: Emergency Medicine | Admitting: Emergency Medicine

## 2021-06-06 DIAGNOSIS — M7541 Impingement syndrome of right shoulder: Secondary | ICD-10-CM | POA: Insufficient documentation

## 2021-06-06 DIAGNOSIS — M25511 Pain in right shoulder: Secondary | ICD-10-CM | POA: Diagnosis present

## 2021-06-06 DIAGNOSIS — Z7951 Long term (current) use of inhaled steroids: Secondary | ICD-10-CM | POA: Insufficient documentation

## 2021-06-06 DIAGNOSIS — Z7982 Long term (current) use of aspirin: Secondary | ICD-10-CM | POA: Insufficient documentation

## 2021-06-06 DIAGNOSIS — Z7984 Long term (current) use of oral hypoglycemic drugs: Secondary | ICD-10-CM | POA: Insufficient documentation

## 2021-06-06 DIAGNOSIS — I509 Heart failure, unspecified: Secondary | ICD-10-CM | POA: Diagnosis not present

## 2021-06-06 DIAGNOSIS — Z91040 Latex allergy status: Secondary | ICD-10-CM | POA: Insufficient documentation

## 2021-06-06 DIAGNOSIS — I110 Hypertensive heart disease with heart failure: Secondary | ICD-10-CM | POA: Insufficient documentation

## 2021-06-06 DIAGNOSIS — Z79899 Other long term (current) drug therapy: Secondary | ICD-10-CM | POA: Diagnosis not present

## 2021-06-06 DIAGNOSIS — Z853 Personal history of malignant neoplasm of breast: Secondary | ICD-10-CM | POA: Diagnosis not present

## 2021-06-06 DIAGNOSIS — E119 Type 2 diabetes mellitus without complications: Secondary | ICD-10-CM | POA: Diagnosis not present

## 2021-06-06 DIAGNOSIS — J449 Chronic obstructive pulmonary disease, unspecified: Secondary | ICD-10-CM | POA: Diagnosis not present

## 2021-06-06 DIAGNOSIS — J45909 Unspecified asthma, uncomplicated: Secondary | ICD-10-CM | POA: Diagnosis not present

## 2021-06-06 MED ORDER — DICLOFENAC SODIUM 1 % EX GEL: 2.0000 g | Freq: Four times a day (QID) | CUTANEOUS | 0 refills | Status: AC | PRN

## 2021-06-06 MED ORDER — LIDOCAINE 5 % EX PTCH: 1.0000 | MEDICATED_PATCH | Freq: Two times a day (BID) | CUTANEOUS | 0 refills | Status: DC

## 2021-06-06 NOTE — ED Triage Notes (Signed)
Pt comes with c/o right shoulder pain that started week ago. Pt denies any injury or CP.

## 2021-06-06 NOTE — Discharge Instructions (Addendum)
Take tylenol by mouth for pain in addition to topical diclofenac gel and lidocaine patches on the painful shoulder area.

## 2021-06-06 NOTE — ED Provider Notes (Signed)
[facility] Emergency Department Provider Note  ____________________________________________  Time seen: Approximately 1:49 PM  I have reviewed the triage vital signs and the nursing notes.   HISTORY  Chief Complaint Shoulder Pain    HPI Melissa Harrell is a 60 y.o. female with a history of CHF COPD diabetes hypertension breast cancer who complains of right shoulder pain over the past week.  No trauma or heavy lifting.  Gradual onset and worsening.  Worse with lifting the arm up above her head.  No alleviating factors.  Nonradiating, no associated weakness or paresthesia.  No chest pain or shortness of breath.  Not exertional, not pleuritic.  No fever.    Past Medical History:  Diagnosis Date   Anxiety    Arthritis    Asthma    Breast cancer ([place]) [phone]   right    CHF (congestive heart failure) (HCC)    COPD (chronic obstructive pulmonary disease) ([place])    Depression    Diabetes mellitus without complication (HCC)    GERD (gastroesophageal reflux disease)    Hyperlipidemia    Hypertension    Personal history of chemotherapy    Personal history of radiation therapy    Pulmonary emboli [facility])      Patient Active Problem List   Diagnosis Date Noted   Muscle spasm of left calf 07/10/2017   Chronic diastolic heart failure ([place]) 04/07/2017   Knee pain, bilateral 12/28/2016   Strain of left biceps 04/26/2016   Tachycardia 03/28/2016   Hypotension 09/21/2015   Cancer of breast ([place]) 08/02/2015   Orthostatic hypotension 03/25/2015   Diabetes mellitus type 2, controlled ([place]) 03/25/2015   Morbid obesity ([place]) 03/25/2015     Past Surgical History:  Procedure Laterality Date   ABDOMINAL HYSTERECTOMY     BREAST BIOPSY Right 2006   positive   BREAST EXCISIONAL BIOPSY Right 2003   positive, chemo and radiation   COLONOSCOPY WITH PROPOFOL N/A 12/30/2017   Procedure: COLONOSCOPY WITH PROPOFOL;  Surgeon: Jonathon Bellows, MD;  Location: [facility] ENDOSCOPY;   Service: Gastroenterology;  Laterality: N/A;   MASTECTOMY Right 2006   TUBAL LIGATION Bilateral      Prior to Admission medications   Medication Sig Start Date End Date Taking? Authorizing Provider  diclofenac Sodium (VOLTAREN) 1 % GEL Apply 2 g topically 4 (four) times daily as needed. Apply to outside of right shoulder at painful area. 06/06/21  Yes Carrie Mew, MD  lidocaine (LIDODERM) 5 % Place 1 patch onto the skin every 12 (twelve) hours. Remove & Discard patch within 12 hours or as directed by MD 06/06/21  Yes Carrie Mew, MD  albuterol (PROVENTIL HFA;VENTOLIN HFA) 108 (90 BASE) MCG/ACT inhaler Inhale 2 puffs into the lungs every 6 (six) hours as needed for wheezing or shortness of breath.    [provider]  aspirin 81 MG tablet Take 81 mg by mouth daily.    [provider]  BIOTIN PO Take 500 mcg by mouth daily.    [provider]  Calcium Carb-Cholecalciferol 600-200 MG-UNIT TABS Take 1 tablet by mouth 2 (two) times daily.    [provider]  carvedilol (COREG) 12.5 MG tablet Take 1 tablet (12.5 mg total) by mouth 2 (two) times daily with a meal. 04/26/21   Alisa Graff, FNP  citalopram (CELEXA) 20 MG tablet Take 20 mg by mouth daily.    [provider]  ENTRESTO 24-26 MG TAKE 1 TABLET BY MOUTH TWICE A DAY 01/07/21  Hackney, Tina A, FNP  fluticasone (FLONASE) 50 MCG/ACT nasal spray Place 2 sprays into both nostrils daily.    [provider]  fluticasone (FLOVENT HFA) 110 MCG/ACT inhaler Inhale 2 puffs into the lungs 2 (two) times daily.    [provider]  furosemide (LASIX) 20 MG tablet Take 20 mg by mouth daily.    [provider]  gabapentin (NEURONTIN) 300 MG capsule Take 300 mg by mouth at bedtime.    [provider]  glipiZIDE (GLUCOTROL XL) 5 MG 24 hr tablet Take 5 mg by mouth daily with breakfast.    [provider]  loratadine (CLARITIN) 10 MG tablet Take 10 mg by mouth daily.     [provider]  magnesium oxide (MAG-OX) 400 MG tablet Take 400 mg daily by mouth.    [provider]  metFORMIN (GLUCOPHAGE) 500 MG tablet Take 1,000 mg by mouth 2 (two) times daily with a meal.     [provider]  Multiple Vitamin (MULTIVITAMIN) capsule Take 1 capsule daily by mouth.    [provider]  Omega 3 1000 MG CAPS Take 1 capsule by mouth daily.    [provider]  omeprazole (PRILOSEC) 20 MG capsule Take 20 mg by mouth 2 (two) times daily before a meal.    [provider]  oxybutynin (DITROPAN) 5 MG tablet Take 5 mg by mouth 2 (two) times daily.    [provider]  simvastatin (ZOCOR) 40 MG tablet Take 40 mg by mouth daily.    [provider]  spironolactone (ALDACTONE) 25 MG tablet Take 25 mg by mouth daily.    [provider]  traZODone (DESYREL) 50 MG tablet Take 50 mg by mouth at bedtime.    [provider]  vitamin C (ASCORBIC ACID) 500 MG tablet Take 500 mg by mouth daily.    [provider]     Allergies Bee venom, Sulfa antibiotics, Vicodin [hydrocodone-acetaminophen], Latex, and Penicillins   Family History  Problem Relation Age of Onset   Cancer - Cervical Maternal Grandmother    Heart disease Mother    Heart failure Sister    Breast cancer Cousin     Social History Social History   Tobacco Use   Smoking status: Never   Smokeless tobacco: Never  Substance Use Topics   Alcohol use: No    Alcohol/week: 0.0 standard drinks   Drug use: No    Review of Systems  Constitutional:   No fever or chills.  ENT:   No sore throat. No rhinorrhea. Cardiovascular:   No chest pain or syncope. Respiratory:   No dyspnea or cough. Gastrointestinal:   Negative for abdominal pain, vomiting and diarrhea.  Musculoskeletal:   Right shoulder pain All other systems reviewed and are negative except as documented above in ROS and  HPI.  ____________________________________________   PHYSICAL EXAM:  VITAL SIGNS: ED Triage Vitals  Enc Vitals Group     BP 06/06/21 1137 (!) 148/57     Pulse Rate 06/06/21 1137 76     Resp 06/06/21 1137 19     Temp 06/06/21 1137 98.2 F (36.8 C)     Temp src --      SpO2 06/06/21 1137 99 %     Weight --      Height --      Head Circumference --      Peak Flow --      Pain Score 06/06/21 1136 10  Pain Loc --      Pain Edu? --      Excl. in [place]? --     Vital signs reviewed, nursing assessments reviewed.   Constitutional:   Alert and oriented. Non-toxic appearance. Eyes:   Conjunctivae are normal. EOMI. ENT      Head:   Normocephalic and atraumatic.      Neck:   No meningismus. Full ROM. Hematological/Lymphatic/Immunilogical:   No cervical lymphadenopathy. Cardiovascular:   RRR.  Cap refill less than 2 seconds. Respiratory:   Normal respiratory effort without tachypnea/retractions.  Musculoskeletal:   Normal range of motion in all extremities.  No edema.  Pain is reproduced by abducting right arm into an overhead position.  Pain is centered around the proximal lateral deltoid with swelling and tenderness in the muscle belly.  Range of motion is intact and painless otherwise.  No focal bony tenderness or deformity.  No wounds or inflammatory soft tissue changes. Neurologic:   Normal speech and language.  Motor grossly intact. No acute focal neurologic deficits are appreciated.  Skin:    Skin is warm, dry and intact. No rash noted.  No wounds.  ____________________________________________    LABS (pertinent positives/negatives) (all labs ordered are listed, but only abnormal results are displayed) Labs Reviewed - No data to display ____________________________________________   EKG  ____________________________________________    RADIOLOGY  No results  found.  ____________________________________________   PROCEDURES Procedures  ____________________________________________  CLINICAL IMPRESSION / ASSESSMENT AND PLAN / ED COURSE  Pertinent labs & imaging results that were available during my care of the patient were reviewed by me and considered in my medical decision making (see chart for details).  Melissa Harrell was evaluated in Emergency Department on 06/06/2021 for the symptoms described in the history of present illness. She was evaluated in the context of the global COVID-19 pandemic, which necessitated consideration that the patient might be at risk for infection with the SARS-CoV-2 virus that causes COVID-19. Institutional protocols and algorithms that pertain to the evaluation of patients at risk for COVID-19 are in a state of rapid change based on information released by regulatory bodies including the CDC and federal and state organizations. These policies and algorithms were followed during the patient's care in the ED.   Patient presents with right shoulder pain which appears to be due to an impingement syndrome.  Doubt septic arthritis, soft tissue infection, osteomyelitis, fracture, dislocation, tendon or ligament rupture.  Doubt ACS PE dissection or pneumothorax.  Will provide sling for comfort, counseled her against using it excessively.  Tylenol, topical diclofenac and Lidoderm, follow-up with orthopedics.  No trauma history, x-ray not indicated right now.      ____________________________________________   FINAL CLINICAL IMPRESSION(S) / ED DIAGNOSES    Final diagnoses:  Shoulder impingement syndrome, right     ED Discharge Orders          Ordered    lidocaine (LIDODERM) 5 %  Every 12 hours        06/06/21 1349    diclofenac Sodium (VOLTAREN) 1 % GEL  4 times daily PRN        06/06/21 1349            Portions of this note were generated with dragon dictation software. Dictation errors may occur despite  best attempts at proofreading.   Carrie Mew, MD 06/06/21 1359

## 2021-07-03 ENCOUNTER — Other Ambulatory Visit: Payer: Self-pay | Admitting: Family Medicine

## 2021-07-03 DIAGNOSIS — Z1231 Encounter for screening mammogram for malignant neoplasm of breast: Secondary | ICD-10-CM

## 2021-08-11 ENCOUNTER — Ambulatory Visit
Admission: RE | Admit: 2021-08-11 | Discharge: 2021-08-11 | Disposition: A | Discharge status: Home / Self Care | Payer: Medicare Other | Source: Ambulatory Visit | Attending: Family Medicine | Admitting: Family Medicine

## 2021-08-11 ENCOUNTER — Other Ambulatory Visit: Payer: Self-pay

## 2021-08-11 DIAGNOSIS — Z1231 Encounter for screening mammogram for malignant neoplasm of breast: Secondary | ICD-10-CM | POA: Diagnosis not present

## 2021-11-21 ENCOUNTER — Ambulatory Visit: Payer: Medicare Other | Admitting: Family

## 2021-11-21 ENCOUNTER — Telehealth: Payer: Self-pay | Admitting: Family

## 2021-11-21 NOTE — Progress Notes (Deleted)
Patient ID: Melissa Harrell, female    DOB: Apr 03, 1961, 60 y.o.   MRN: [id number]  HPI  Melissa Harrell is a 60 year old female with a history of morbid obesity, COPD, diabetes, HTN, pulmonary emboli and systolic heart failure who returns today for a f/u visit.   Echo report from 05/16/21 reviewed and showed an EF of 65-70% along with moderate LVH and mild MR. Last echo done 02/08/17 which showed an EF of 50% along with mild MR/TR. Previous echo done 03/14/15 with an EF of 45% without any stenosis or regurgitation.   Was in the ED 06/06/21 due to right shoulder pain due to possible impingement. Evaluated and released.   Returns today for a follow-up visit with a chief complaint of   Medications are now getting pill packed through Melissa Harrell pharmacy and Melissa Harrell says that has helped Melissa Harrell out greatly. Says that Melissa Harrell previously tried to take empagliflozin and it caused yeast infection and Melissa Harrell subsequently stopped taking it.   Past Medical History:  Diagnosis Date   Anxiety    Arthritis    Asthma    Breast cancer ([place]) [phone]   right    CHF (congestive heart failure) (HCC)    COPD (chronic obstructive pulmonary disease) (HCC)    Depression    Diabetes mellitus without complication (HCC)    GERD (gastroesophageal reflux disease)    Hyperlipidemia    Hypertension    Personal history of chemotherapy    Personal history of radiation therapy    Pulmonary emboli [facility])    Past Surgical History:  Procedure Laterality Date   ABDOMINAL HYSTERECTOMY     BREAST BIOPSY Right 2006   positive   BREAST EXCISIONAL BIOPSY Right 2003   positive, chemo and radiation   COLONOSCOPY WITH PROPOFOL N/A 12/30/2017   Procedure: COLONOSCOPY WITH PROPOFOL;  Surgeon: Jonathon Bellows, MD;  Location: [facility] ENDOSCOPY;  Service: Gastroenterology;  Laterality: N/A;   MASTECTOMY Right 2006   TUBAL LIGATION Bilateral    Family History  Problem Relation Age of Onset   Cancer - Cervical Maternal Grandmother    Heart disease Mother    Heart  failure Sister    Breast cancer Cousin    Social History   Tobacco Use   Smoking status: Never   Smokeless tobacco: Never  Substance Use Topics   Alcohol use: No    Alcohol/week: 0.0 standard drinks   Allergies  Allergen Reactions   Bee Venom Swelling   Sulfa Antibiotics Hives   Vicodin [Hydrocodone-Acetaminophen] Nausea And Vomiting   Latex Itching and Rash   Penicillins Itching      Review of Systems  Constitutional:  Negative for appetite change and fatigue.  HENT:  Negative for congestion, postnasal drip and sore throat.   Eyes: Negative.   Respiratory:  Negative for cough, chest tightness and shortness of breath.   Cardiovascular:  Negative for chest pain, palpitations and leg swelling.  Gastrointestinal:  Negative for abdominal distention and abdominal pain.  Endocrine: Negative.   Genitourinary: Negative.   Musculoskeletal:  Positive for arthralgias (right shoulder).  Skin: Negative.   Allergic/Immunologic: Negative.   Neurological:  Negative for dizziness and light-headedness.  Hematological:  Negative for adenopathy. Does not bruise/bleed easily.  Psychiatric/Behavioral:  Negative for dysphoric mood and sleep disturbance (sleeping on 1 pillow). The patient is not nervous/anxious.       Physical Exam Vitals reviewed.  Constitutional:      Appearance: Normal appearance.  HENT:     Head:  Normocephalic and atraumatic.  Cardiovascular:     Rate and Rhythm: Normal rate and regular rhythm.  Pulmonary:     Effort: Pulmonary effort is normal. No respiratory distress.     Breath sounds: No wheezing or rales.  Abdominal:     General: There is no distension.     Palpations: Abdomen is soft.     Tenderness: There is no abdominal tenderness.  Musculoskeletal:        General: Tenderness (top of right shoulder; good ROM) present.     Cervical back: Normal range of motion and neck supple.     Right lower leg: No edema.     Left lower leg: No edema.  Skin:     General: Skin is warm and dry.  Neurological:     General: No focal deficit present.     Mental Status: Melissa Harrell is alert and oriented to person, place, and time.  Psychiatric:        Mood and Affect: Mood normal.        Behavior: Behavior normal.        Thought Content: Thought content normal.    Assessment & Plan  1: Chronic heart failure with preserved ejection fraction-  -NYHA Class I today -euvolemic today - weighing daily and Melissa Harrell was reminded to call for an overnight weight gain of >2 pounds or weekly weight gain of >5 pounds.  - weight 271 pounds from last visit here 6 months ago - not adding salt and trying to read food labels - saw cardiologist Petra Kuba) 06/12/21 - on GDMT of entresto - tried to take empagliflozin which caused yeast infections  2: Hypotension- - BP  - saw ([place]) either Jan/ Feb of this year - BMP 09/22/18 reviewed and showed sodium 136, potassium 4.2, creatinine 1.03 and GFR >60; have requested updated lab work from PCP  3: Diabetes- - PCP follows Melissa Harrell for Melissa Harrell diabetes - not checking Melissa Harrell glucose at home - A1c 06/20/21 was 7.2%    Patient did not bring Melissa Harrell medications nor a list. Each medication was verbally reviewed with the patient and Melissa Harrell was encouraged to bring the bottles to every visit to confirm accuracy of list..

## 2021-11-21 NOTE — Telephone Encounter (Signed)
Patient did not show for her [facility] appointment on 11/21/21. Will attempt to reschedule.

## 2021-11-29 ENCOUNTER — Other Ambulatory Visit: Payer: Self-pay | Admitting: Family

## 2021-11-30 ENCOUNTER — Other Ambulatory Visit: Payer: Self-pay | Admitting: Family Medicine

## 2021-12-03 ENCOUNTER — Other Ambulatory Visit: Payer: Self-pay

## 2021-12-03 ENCOUNTER — Encounter: Payer: Self-pay | Admitting: Emergency Medicine

## 2021-12-03 ENCOUNTER — Emergency Department
Admission: EM | Admit: 2021-12-03 | Discharge: 2021-12-03 | Disposition: A | Discharge status: Home / Self Care | Payer: Medicare Other | Attending: Emergency Medicine | Admitting: Emergency Medicine

## 2021-12-03 DIAGNOSIS — I110 Hypertensive heart disease with heart failure: Secondary | ICD-10-CM | POA: Diagnosis not present

## 2021-12-03 DIAGNOSIS — I2510 Atherosclerotic heart disease of native coronary artery without angina pectoris: Secondary | ICD-10-CM | POA: Insufficient documentation

## 2021-12-03 DIAGNOSIS — I509 Heart failure, unspecified: Secondary | ICD-10-CM | POA: Insufficient documentation

## 2021-12-03 DIAGNOSIS — M10071 Idiopathic gout, right ankle and foot: Secondary | ICD-10-CM | POA: Diagnosis not present

## 2021-12-03 DIAGNOSIS — M79671 Pain in right foot: Secondary | ICD-10-CM | POA: Diagnosis present

## 2021-12-03 DIAGNOSIS — M109 Gout, unspecified: Secondary | ICD-10-CM

## 2021-12-03 MED ORDER — COLCHICINE 0.6 MG PO TABS: ORAL_TABLET | ORAL | 1 refills | Status: DC

## 2021-12-03 MED ORDER — KETOROLAC TROMETHAMINE 30 MG/ML IJ SOLN
30.0000 mg | Freq: Once | INTRAMUSCULAR | Status: AC
  Administered 2021-12-03: 30 mg via INTRAMUSCULAR
  Filled 2021-12-03: qty 1

## 2021-12-03 NOTE — ED Triage Notes (Signed)
Pt in via EMS from home with c/o gout in the right foot for 3 days, hx off the same. Pt not able to put weight on it. FSBS 207

## 2021-12-03 NOTE — ED Triage Notes (Signed)
Pt reports gout flare up in her right foot. Pt states hx of the same and feels the same and does not take medication for gout and now the pain is so bad she cannot walk on it.

## 2021-12-03 NOTE — ED Provider Notes (Signed)
[facility] Provider Note    None    (approximate)   History   Foot Pain   HPI  Melissa Harrell is a 61 y.o. female presents to the emergency department with right foot pain similar to previous gout flare.  Patient states that her last gout flare was managed with increase hydration alone.  She states that her pain is much more severe and she would like medication.  She denies fever, chills or history of septic joints.      Physical Exam   Triage Vital Signs: ED Triage Vitals  Enc Vitals Group     BP 12/03/21 1340 113/61     Pulse Rate 12/03/21 1340 85     Resp 12/03/21 1340 17     Temp 12/03/21 1340 99.4 F (37.4 C)     Temp Source 12/03/21 1340 Oral     SpO2 12/03/21 1340 95 %     Weight 12/03/21 1327 271 lb 2.7 oz (123 kg)     Height 12/03/21 1327 5\' 1"  (1.549 m)     Head Circumference --      Peak Flow --      Pain Score 12/03/21 1327 10     Pain Loc --      Pain Edu? --      Excl. in [place]? --     Most recent vital signs: Vitals:   12/03/21 1340  BP: 113/61  Pulse: 85  Resp: 17  Temp: 99.4 F (37.4 C)  SpO2: 95%     General: Awake, no distress.  CV:  Good peripheral perfusion.  Resp:  Normal effort.  Abd:  No distention.  Skin:   Patient has edema and mild erythema over the dorsal midfoot. MSK:   Patient able to perform full range of motion at the right ankle.  Palpable dorsalis pedis pulse bilaterally and symmetrically.    ED Results / Procedures / Treatments   Labs (all labs ordered are listed, but only abnormal results are displayed) Labs Reviewed - No data to display   EKG     RADIOLOGY    PROCEDURES:    Procedures   MEDICATIONS ORDERED IN ED: Medications  ketorolac (TORADOL) 30 MG/ML injection 30 mg (has no administration in time range)     IMPRESSION / MDM / ASSESSMENT AND PLAN / ED COURSE  I reviewed the triage vital signs and the nursing notes.                              Differential  diagnosis includes, but is not limited to, gout, cellulitis, septic joint, musculoskeletal injury...    Assessment and plan Gout 61 year old female with a history of CHF, hypertension, coronary artery disease and colon polyps presents to the emergency department with pain and edema along the dorsal aspect of the right midfoot consistent with prior gout flares in the past.  Patient denied falls or other mechanisms of injury to suggest musculoskeletal pain.  She is had no fever, chills or skin compromise to suggest cellulitis.  Suspect gout at this time.  We will treat with an injection of IM Toradol in the emergency department.  Patient denies history of gastritis or GI bleed.  Will treat patient at home with colchicine.  Return precautions were given to return with new or worsening symptoms.  All patient questions were answered.      FINAL CLINICAL IMPRESSION(S) / ED DIAGNOSES  Final diagnoses:  Acute gout of right foot, unspecified cause     Rx / DC Orders   ED Discharge Orders          Ordered    colchicine 0.6 MG tablet        12/03/21 1547             Note:  This document was prepared using Dragon voice recognition software and may include unintentional dictation errors.    Vallarie Mare [place], PA-C 12/03/21 1554    Nena Polio, MD 12/03/21 2356

## 2021-12-03 NOTE — Discharge Instructions (Addendum)
Take 2 tablets of colchicine.  Wait 1 hour and then take 1 more tablet. Take 1 tablet of colchicine on days 2 through 5 for gout. Use red meat, alcohol and cigarettes sparingly over the next several days to help improve gout.

## 2021-12-04 ENCOUNTER — Telehealth: Payer: Self-pay | Admitting: Emergency Medicine

## 2021-12-04 DIAGNOSIS — M1000 Idiopathic gout, unspecified site: Secondary | ICD-10-CM

## 2021-12-04 MED ORDER — COLCHICINE 0.6 MG PO TABS: ORAL_TABLET | ORAL | 0 refills | Status: AC

## 2021-12-04 NOTE — Telephone Encounter (Signed)
Patient seen in the ED yesterday for foot pain and diagnosed with acute gout flare.  She was discharged with colchicine for the next 5 days.  Unfortunately her pharmacy is closed today and she is requesting this prescription sent to another pharmacy.  Returns access today by me to transfer this prescription to a different pharmacy.  We will discharge with the same prescription as provided by the PA that saw her yesterday.

## 2021-12-14 ENCOUNTER — Ambulatory Visit: Payer: Medicare Other | Admitting: Family

## 2021-12-20 ENCOUNTER — Other Ambulatory Visit: Payer: Self-pay | Admitting: Family Medicine

## 2021-12-22 ENCOUNTER — Encounter: Payer: Self-pay | Admitting: Family

## 2021-12-22 ENCOUNTER — Other Ambulatory Visit: Payer: Self-pay

## 2021-12-22 ENCOUNTER — Ambulatory Visit: Payer: Medicare Other | Attending: Family | Admitting: Family

## 2021-12-22 VITALS — BP 118/54 | HR 84 | Resp 20 | Ht 61.0 in | Wt 274.2 lb

## 2021-12-22 DIAGNOSIS — I952 Hypotension due to drugs: Secondary | ICD-10-CM

## 2021-12-22 DIAGNOSIS — I110 Hypertensive heart disease with heart failure: Secondary | ICD-10-CM | POA: Insufficient documentation

## 2021-12-22 DIAGNOSIS — M25511 Pain in right shoulder: Secondary | ICD-10-CM | POA: Diagnosis not present

## 2021-12-22 DIAGNOSIS — I959 Hypotension, unspecified: Secondary | ICD-10-CM | POA: Insufficient documentation

## 2021-12-22 DIAGNOSIS — I5032 Chronic diastolic (congestive) heart failure: Secondary | ICD-10-CM | POA: Diagnosis not present

## 2021-12-22 DIAGNOSIS — J449 Chronic obstructive pulmonary disease, unspecified: Secondary | ICD-10-CM | POA: Diagnosis not present

## 2021-12-22 DIAGNOSIS — G8929 Other chronic pain: Secondary | ICD-10-CM | POA: Diagnosis not present

## 2021-12-22 DIAGNOSIS — Z86711 Personal history of pulmonary embolism: Secondary | ICD-10-CM | POA: Insufficient documentation

## 2021-12-22 DIAGNOSIS — E119 Type 2 diabetes mellitus without complications: Secondary | ICD-10-CM | POA: Diagnosis not present

## 2021-12-22 DIAGNOSIS — I5042 Chronic combined systolic (congestive) and diastolic (congestive) heart failure: Secondary | ICD-10-CM | POA: Insufficient documentation

## 2021-12-22 DIAGNOSIS — I340 Nonrheumatic mitral (valve) insufficiency: Secondary | ICD-10-CM | POA: Insufficient documentation

## 2021-12-22 NOTE — Progress Notes (Signed)
Patient ID: Melissa Harrell, female    DOB: 05-18-1961, 61 y.o.   MRN: [id number]  HPI  Melissa Harrell is a 61 year old female with a history of morbid obesity, COPD, diabetes, HTN, pulmonary emboli and systolic heart failure who returns today for a f/u visit.   Echo report from 05/16/21 reviewed and showed an EF of 65-70% along with moderate LVH and mild MR. Last echo done 02/08/17 which showed an EF of 50% along with mild MR/TR. Previous echo done 03/14/15 with an EF of 45% without any stenosis or regurgitation.   Was in the ED 12/03/21 due to acute gout in right foot where she was evaluated and released.   Returns today for a follow-up visit with a chief complaint of right shoulder pain. She describes this as chronic in nature. She has no other symptoms and specifically denies any dizziness, difficulty sleeping, abdominal distention, palpitations, pedal edema, chest pain, shortness of breath, cough, fatigue or weight gain.   Past Medical History:  Diagnosis Date   Anxiety    Arthritis    Asthma    Breast cancer ([place]) [phone]   right    CHF (congestive heart failure) (HCC)    COPD (chronic obstructive pulmonary disease) (HCC)    Depression    Diabetes mellitus without complication (HCC)    GERD (gastroesophageal reflux disease)    Hyperlipidemia    Hypertension    Personal history of chemotherapy    Personal history of radiation therapy    Pulmonary emboli [facility])    Past Surgical History:  Procedure Laterality Date   ABDOMINAL HYSTERECTOMY     BREAST BIOPSY Right 2006   positive   BREAST EXCISIONAL BIOPSY Right 2003   positive, chemo and radiation   COLONOSCOPY WITH PROPOFOL N/A 12/30/2017   Procedure: COLONOSCOPY WITH PROPOFOL;  Surgeon: Jonathon Bellows, MD;  Location: [facility] ENDOSCOPY;  Service: Gastroenterology;  Laterality: N/A;   MASTECTOMY Right 2006   TUBAL LIGATION Bilateral    Family History  Problem Relation Age of Onset   Cancer - Cervical Maternal Grandmother    Heart disease  Mother    Heart failure Sister    Breast cancer Cousin    Social History   Tobacco Use   Smoking status: Never   Smokeless tobacco: Never  Substance Use Topics   Alcohol use: No    Alcohol/week: 0.0 standard drinks   Allergies  Allergen Reactions   Bee Venom Swelling   Sulfa Antibiotics Hives   Vicodin [Hydrocodone-Acetaminophen] Nausea And Vomiting   Latex Itching and Rash   Penicillins Itching   Prior to Admission medications   Medication Sig Start Date End Date Taking? Authorizing Provider  albuterol (PROVENTIL HFA;VENTOLIN HFA) 108 (90 BASE) MCG/ACT inhaler Inhale 2 puffs into the lungs every 6 (six) hours as needed for wheezing or shortness of breath.   Yes [provider]  aspirin 81 MG tablet Take 81 mg by mouth daily.   Yes [provider]  BIOTIN PO Take 500 mcg by mouth daily.   Yes [provider]  Calcium Carb-Cholecalciferol 600-200 MG-UNIT TABS Take 1 tablet by mouth 2 (two) times daily.   Yes [provider]  carvedilol (COREG) 12.5 MG tablet TAKE 1 TABLET BY MOUTH TWICE A DAY WITH A MEAL 11/29/21  Yes Naimah Yingst A, FNP  citalopram (CELEXA) 20 MG tablet Take 20 mg by mouth daily.   Yes [provider]  diclofenac Sodium (VOLTAREN) 1 % GEL Apply 2 g  topically 4 (four) times daily as needed. Apply to outside of right shoulder at painful area. 06/06/21  Yes Carrie Mew, MD  ENTRESTO 24-26 MG TAKE 1 TABLET BY MOUTH TWICE A DAY 01/07/21  Yes Darylene Price A, FNP  fluticasone (FLONASE) 50 MCG/ACT nasal spray Place 2 sprays into both nostrils daily.   Yes [provider]  fluticasone (FLOVENT HFA) 110 MCG/ACT inhaler Inhale 2 puffs into the lungs 2 (two) times daily.   Yes [provider]  furosemide (LASIX) 20 MG tablet Take 20 mg by mouth daily.   Yes [provider]  gabapentin (NEURONTIN) 300 MG capsule Take 300 mg by mouth at bedtime.   Yes [provider]  glipiZIDE (GLUCOTROL XL) 5  MG 24 hr tablet Take 5 mg by mouth daily with breakfast.   Yes [provider]  loratadine (CLARITIN) 10 MG tablet Take 10 mg by mouth daily.   Yes [provider]  magnesium oxide (MAG-OX) 400 MG tablet Take 400 mg daily by mouth.   Yes [provider]  metFORMIN (GLUCOPHAGE) 500 MG tablet Take 1,000 mg by mouth 2 (two) times daily with a meal.    Yes [provider]  Multiple Vitamin (MULTIVITAMIN) capsule Take 1 capsule daily by mouth.   Yes [provider]  Omega 3 1000 MG CAPS Take 1 capsule by mouth daily.   Yes [provider]  omeprazole (PRILOSEC) 20 MG capsule Take 20 mg by mouth 2 (two) times daily before a meal.   Yes [provider]  oxybutynin (DITROPAN) 5 MG tablet Take 5 mg by mouth 2 (two) times daily.   Yes [provider]  simvastatin (ZOCOR) 40 MG tablet Take 40 mg by mouth daily.   Yes [provider]  spironolactone (ALDACTONE) 25 MG tablet Take 25 mg by mouth daily.   Yes [provider]  traZODone (DESYREL) 50 MG tablet Take 50 mg by mouth at bedtime.   Yes [provider]  vitamin C (ASCORBIC ACID) 500 MG tablet Take 500 mg by mouth daily.   Yes [provider]  colchicine 0.6 MG tablet Take 2 tablets by mouth. Wait one hour and take one more tablet. On days 2-5, take one tablet for gout. Patient not taking: Reported on 12/22/2021 12/04/21   Vladimir Crofts, MD   Review of Systems  Constitutional:  Negative for appetite change and fatigue.  HENT:  Negative for congestion, postnasal drip and sore throat.   Eyes: Negative.   Respiratory:  Negative for cough, chest tightness and shortness of breath.   Cardiovascular:  Negative for chest pain, palpitations and leg swelling.  Gastrointestinal:  Negative for abdominal distention and abdominal pain.  Endocrine: Negative.   Genitourinary: Negative.   Musculoskeletal:  Positive for arthralgias (right shoulder).  Skin:  Negative.   Allergic/Immunologic: Negative.   Neurological:  Negative for dizziness and light-headedness.  Hematological:  Negative for adenopathy. Does not bruise/bleed easily.  Psychiatric/Behavioral:  Negative for dysphoric mood and sleep disturbance (sleeping on 1 pillow). The patient is not nervous/anxious.    Vitals:   12/22/21 1321  BP: (!) 118/54  Pulse: 84  Resp: 20  SpO2: 100%  Weight: 274 lb 4 oz (124.4 kg)  Height: 5\' 1"  (1.549 m)   Wt Readings from Last 3 Encounters:  12/22/21 274 lb 4 oz (124.4 kg)  12/03/21 268 lb (121.6 kg)  05/31/21 271 lb (122.9 kg)   Lab Results  Component Value Date   CREATININE  1.03 (H) 08/02/2015   CREATININE 1.05 07/27/2014   CREATININE 1.07 07/02/2014   Physical Exam Vitals reviewed.  Constitutional:      Appearance: Normal appearance.  HENT:     Head: Normocephalic and atraumatic.  Cardiovascular:     Rate and Rhythm: Normal rate and regular rhythm.  Pulmonary:     Effort: Pulmonary effort is normal. No respiratory distress.     Breath sounds: No wheezing or rales.  Abdominal:     General: There is no distension.     Palpations: Abdomen is soft.     Tenderness: There is no abdominal tenderness.  Musculoskeletal:        General: Tenderness (top of right shoulder; good ROM) present.     Cervical back: Normal range of motion and neck supple.     Right lower leg: No edema.     Left lower leg: No edema.  Skin:    General: Skin is warm and dry.  Neurological:     General: No focal deficit present.     Mental Status: She is alert and oriented to person, place, and time.  Psychiatric:        Mood and Affect: Mood normal.        Behavior: Behavior normal.        Thought Content: Thought content normal.    Assessment & Plan  1: Chronic heart failure with preserved ejection fraction-  -NYHA Class I today -euvolemic today - weighing daily and she was reminded to call for an overnight weight gain of >2 pounds or weekly weight  gain of >5 pounds.  - weight up 3 pounds from last visit here 6 months ago - not adding salt and trying to read food labels - saw cardiologist Nehemiah Massed) 03/24/20 - on GDMT of entresto - tried to take empagliflozin which caused yeast infections  2: Hypotension- - BP looks good (118/54) - sees (Aycock) on 01/15/22 - BMP 09/22/18 reviewed and showed sodium 136, potassium 4.2, creatinine 1.03 and GFR >60  3: Diabetes- - PCP follows her for her diabetes - glucose 206 at home   Patient did not bring her medications nor a list. Each medication was verbally reviewed with the patient and she was encouraged to bring the bottles to every visit to confirm accuracy of list..  Return in 6 months, sooner if needed

## 2021-12-22 NOTE — Patient Instructions (Signed)
Continue weighing daily and call for an overnight weight gain of 3 pounds or more or a weekly weight gain of more than 5 pounds.  ° °The [facility] will be moving around the corner to [id number] mid-February. Our phone number will remain the same ° °

## 2022-06-13 ENCOUNTER — Other Ambulatory Visit: Payer: Self-pay | Admitting: Family

## 2022-06-18 NOTE — Progress Notes (Signed)
Patient ID: Melissa Harrell, female    DOB: 01/04/1961, 61 y.o.   MRN: [id number]  HPI  Melissa Harrell is a 61 year old female with a history of morbid obesity, COPD, diabetes, HTN, pulmonary emboli and systolic heart failure who returns today for a f/u visit.   Echo report from 05/16/21 reviewed and showed an EF of 65-70% along with moderate LVH and mild MR. Last echo done 02/08/17 which showed an EF of 50% along with mild MR/TR. Previous echo done 03/14/15 with an EF of 45% without any stenosis or regurgitation.   Has not been admitted or been in the ED in the last 6 months.   Returns today for a follow-up visit with a chief complaint of knee/ shoulder pain. Describes this as chronic in nature. She has no other symptoms and specifically denies any difficulty sleeping, abdominal distention, palpitations, pedal edema, chest pain, shortness of breath, cough, fatigue, dizziness or weight gain.   Says that she's trying to eat better and limit certain foods, like red meat, that triggers her gout.   Past Medical History:  Diagnosis Date   Anxiety    Arthritis    Asthma    Breast cancer (HCC) [phone]   right    CHF (congestive heart failure) (HCC)    COPD (chronic obstructive pulmonary disease) (HCC)    Depression    Diabetes mellitus without complication (HCC)    GERD (gastroesophageal reflux disease)    Hyperlipidemia    Hypertension    Personal history of chemotherapy    Personal history of radiation therapy    Pulmonary emboli [facility])    Past Surgical History:  Procedure Laterality Date   ABDOMINAL HYSTERECTOMY     BREAST BIOPSY Right 2006   positive   BREAST EXCISIONAL BIOPSY Right 2003   positive, chemo and radiation   COLONOSCOPY WITH PROPOFOL N/A 12/30/2017   Procedure: COLONOSCOPY WITH PROPOFOL;  Surgeon: Wyline Mood, MD;  Location: [facility] ENDOSCOPY;  Service: Gastroenterology;  Laterality: N/A;   MASTECTOMY Right 2006   TUBAL LIGATION Bilateral    Family History  Problem Relation Age  of Onset   Cancer - Cervical Maternal Grandmother    Heart disease Mother    Heart failure Sister    Breast cancer Cousin    Social History   Tobacco Use   Smoking status: Never   Smokeless tobacco: Never  Substance Use Topics   Alcohol use: No    Alcohol/week: 0.0 standard drinks of alcohol   Allergies  Allergen Reactions   Bee Venom Swelling   Sulfa Antibiotics Hives   Vicodin [Hydrocodone-Acetaminophen] Nausea And Vomiting   Latex Itching and Rash   Penicillins Itching   Prior to Admission medications   Medication Sig Start Date End Date Taking? Authorizing Provider  albuterol (PROVENTIL HFA;VENTOLIN HFA) 108 (90 BASE) MCG/ACT inhaler Inhale 2 puffs into the lungs every 6 (six) hours as needed for wheezing or shortness of breath.   Yes [provider]  allopurinol (ZYLOPRIM) 100 MG tablet Take 100 mg by mouth daily.   Yes [provider]  aspirin 81 MG tablet Take 81 mg by mouth daily.   Yes [provider]  BIOTIN PO Take 500 mcg by mouth daily.   Yes [provider]  Calcium Carb-Cholecalciferol 600-200 MG-UNIT TABS Take 1 tablet by mouth 2 (two) times daily.   Yes [provider]  carvedilol (COREG) 12.5 MG tablet TAKE 1 TABLET BY MOUTH TWICE A DAY WITH A MEAL  06/13/22  Yes Gladys Gutman, Inetta Fermo A, FNP  citalopram (CELEXA) 20 MG tablet Take 20 mg by mouth daily.   Yes [provider]  diclofenac Sodium (VOLTAREN) 1 % GEL Apply 2 g topically 4 (four) times daily as needed. Apply to outside of right shoulder at painful area. 06/06/21  Yes Sharman Cheek, MD  ENTRESTO 24-26 MG TAKE 1 TABLET BY MOUTH TWICE A DAY 06/13/22  Yes Clarisa Kindred A, FNP  fluticasone (FLONASE) 50 MCG/ACT nasal spray Place 2 sprays into both nostrils daily.   Yes [provider]  fluticasone (FLOVENT HFA) 110 MCG/ACT inhaler Inhale 2 puffs into the lungs 2 (two) times daily.   Yes [provider]  furosemide (LASIX) 20 MG tablet Take 20 mg by  mouth daily.   Yes [provider]  gabapentin (NEURONTIN) 300 MG capsule Take 300 mg by mouth at bedtime.   Yes [provider]  glipiZIDE (GLUCOTROL XL) 5 MG 24 hr tablet Take 5 mg by mouth daily with breakfast.   Yes [provider]  loratadine (CLARITIN) 10 MG tablet Take 10 mg by mouth daily.   Yes [provider]  magnesium oxide (MAG-OX) 400 MG tablet Take 400 mg daily by mouth.   Yes [provider]  metFORMIN (GLUCOPHAGE) 500 MG tablet Take 1,000 mg by mouth 2 (two) times daily with a meal.    Yes [provider]  Multiple Vitamin (MULTIVITAMIN) capsule Take 1 capsule daily by mouth.   Yes [provider]  Omega 3 1000 MG CAPS Take 1 capsule by mouth daily.   Yes [provider]  omeprazole (PRILOSEC) 20 MG capsule Take 20 mg by mouth 2 (two) times daily before a meal.   Yes [provider]  oxybutynin (DITROPAN) 5 MG tablet Take 5 mg by mouth 2 (two) times daily.   Yes [provider]  simvastatin (ZOCOR) 40 MG tablet Take 40 mg by mouth daily.   Yes [provider]  spironolactone (ALDACTONE) 25 MG tablet Take 25 mg by mouth daily.   Yes [provider]  traZODone (DESYREL) 50 MG tablet Take 50 mg by mouth at bedtime.   Yes [provider]  vitamin C (ASCORBIC ACID) 500 MG tablet Take 500 mg by mouth daily.   Yes [provider]  colchicine 0.6 MG tablet Take 2 tablets by mouth. Wait one hour and take one more tablet. On days 2-5, take one tablet for gout. Patient not taking: Reported on 12/22/2021 12/04/21   Delton Prairie, MD   Review of Systems  Constitutional:  Negative for appetite change and fatigue.  HENT:  Negative for congestion, postnasal drip and sore throat.   Eyes: Negative.   Respiratory:  Negative for cough, chest tightness and shortness of breath.   Cardiovascular:  Negative for chest pain, palpitations and leg swelling.  Gastrointestinal:   Negative for abdominal distention and abdominal pain.  Endocrine: Negative.   Genitourinary: Negative.   Musculoskeletal:  Positive for arthralgias (shoulders/knees).  Skin: Negative.   Allergic/Immunologic: Negative.   Neurological:  Negative for dizziness and light-headedness.  Hematological:  Negative for adenopathy. Does not bruise/bleed easily.  Psychiatric/Behavioral:  Negative for dysphoric mood and sleep disturbance (sleeping on 1 pillow). The patient is not nervous/anxious.    Vitals:   06/20/22 1106  BP: 120/60  Pulse: 80  Resp: 14  SpO2: 100%  Weight: 265 lb 2 oz (120.3 kg)  Height: 5\' 1"  (1.549 m)   Wt Readings from Last  3 Encounters:  06/20/22 265 lb 2 oz (120.3 kg)  12/22/21 274 lb 4 oz (124.4 kg)  12/03/21 268 lb (121.6 kg)   Lab Results  Component Value Date   CREATININE 1.03 (H) 08/02/2015   CREATININE 1.05 07/27/2014   CREATININE 1.07 07/02/2014   Physical Exam Vitals reviewed.  Constitutional:      Appearance: Normal appearance.  HENT:     Head: Normocephalic and atraumatic.  Cardiovascular:     Rate and Rhythm: Normal rate and regular rhythm.  Pulmonary:     Effort: Pulmonary effort is normal. No respiratory distress.     Breath sounds: No wheezing or rales.  Abdominal:     General: There is no distension.     Palpations: Abdomen is soft.     Tenderness: There is no abdominal tenderness.  Musculoskeletal:     Cervical back: Normal range of motion and neck supple.     Right lower leg: Edema (trace pitting) present.     Left lower leg: Edema (trace pitting) present.  Skin:    General: Skin is warm and dry.  Neurological:     General: No focal deficit present.     Mental Status: She is alert and oriented to person, place, and time.  Psychiatric:        Mood and Affect: Mood normal.        Behavior: Behavior normal.        Thought Content: Thought content normal.   Assessment & Plan  1: Chronic heart failure with preserved ejection  fraction-  - NYHA Class I today - euvolemic today - weighing daily and she was reminded to call for an overnight weight gain of >2 pounds or weekly weight gain of >5 pounds.  - weight down 9 pounds from last visit here 6 months ago - not adding salt and reading food labels for sodium content - saw cardiologist Gwen Pounds) 06/12/21 - on GDMT of entresto - tried to take empagliflozin which caused yeast infections  2: Hypotension- - BP looks good (120/60) - saw Nemiah Commander) 04/17/22 - BMP 04/17/22 reviewed and showed sodium 139, potassium 4.5, creatinine 1.1 and GFR 61  3: Diabetes- - PCP follows her for her diabetes - glucose at home today was 190   Patient did not bring her medications nor a list. Each medication was verbally reviewed with the patient and she was encouraged to bring the bottles to every visit to confirm accuracy of list..  Return in 6 months, sooner if needed.

## 2022-06-20 ENCOUNTER — Ambulatory Visit: Payer: Medicare Other | Attending: Family | Admitting: Family

## 2022-06-20 ENCOUNTER — Encounter: Payer: Self-pay | Admitting: Family

## 2022-06-20 VITALS — BP 120/60 | HR 80 | Resp 14 | Ht 61.0 in | Wt 265.1 lb

## 2022-06-20 DIAGNOSIS — Z86711 Personal history of pulmonary embolism: Secondary | ICD-10-CM | POA: Diagnosis not present

## 2022-06-20 DIAGNOSIS — I959 Hypotension, unspecified: Secondary | ICD-10-CM | POA: Insufficient documentation

## 2022-06-20 DIAGNOSIS — E119 Type 2 diabetes mellitus without complications: Secondary | ICD-10-CM | POA: Insufficient documentation

## 2022-06-20 DIAGNOSIS — M25519 Pain in unspecified shoulder: Secondary | ICD-10-CM | POA: Insufficient documentation

## 2022-06-20 DIAGNOSIS — I110 Hypertensive heart disease with heart failure: Secondary | ICD-10-CM | POA: Diagnosis not present

## 2022-06-20 DIAGNOSIS — I5032 Chronic diastolic (congestive) heart failure: Secondary | ICD-10-CM | POA: Insufficient documentation

## 2022-06-20 DIAGNOSIS — J449 Chronic obstructive pulmonary disease, unspecified: Secondary | ICD-10-CM | POA: Diagnosis not present

## 2022-06-20 DIAGNOSIS — I952 Hypotension due to drugs: Secondary | ICD-10-CM

## 2022-06-20 NOTE — Patient Instructions (Signed)
Continue weighing daily and call for an overnight weight gain of 3 pounds or more or a weekly weight gain of more than 5 pounds.   If you have voicemail, please make sure your mailbox is cleaned out so that we may leave a message and please make sure to listen to any voicemails.     

## 2022-07-04 ENCOUNTER — Other Ambulatory Visit: Payer: Self-pay | Admitting: Family Medicine

## 2022-09-19 ENCOUNTER — Other Ambulatory Visit: Payer: Self-pay | Admitting: Internal Medicine

## 2022-09-20 ENCOUNTER — Other Ambulatory Visit: Payer: Self-pay | Admitting: Internal Medicine

## 2022-09-20 DIAGNOSIS — N644 Mastodynia: Secondary | ICD-10-CM

## 2022-10-04 ENCOUNTER — Ambulatory Visit
Admission: RE | Admit: 2022-10-04 | Discharge: 2022-10-04 | Disposition: A | Discharge status: Home / Self Care | Payer: Medicare Other | Source: Ambulatory Visit | Attending: Internal Medicine | Admitting: Internal Medicine

## 2022-10-04 DIAGNOSIS — N644 Mastodynia: Secondary | ICD-10-CM | POA: Insufficient documentation

## 2022-10-10 ENCOUNTER — Telehealth: Payer: Self-pay | Admitting: *Deleted

## 2022-10-10 ENCOUNTER — Other Ambulatory Visit: Payer: Self-pay | Admitting: *Deleted

## 2022-10-10 DIAGNOSIS — Z86010 Personal history of colonic polyps: Secondary | ICD-10-CM

## 2022-10-10 MED ORDER — PEG 3350-KCL-NABCB-NACL-NASULF 236 G PO SOLR: ORAL | 0 refills | Status: AC

## 2022-10-10 NOTE — Telephone Encounter (Signed)
Gastroenterology Pre-Procedure Review  Request Date: 12/31/2022 Requesting Physician: Dr. Vicente Males  PATIENT REVIEW QUESTIONS: The patient responded to the following health history questions as indicated:    1. Are you having any GI issues? no 2. Do you have a personal history of Polyps? yes (last colonoscopy 12/30/2017) 3. Do you have a family history of Colon Cancer or Polyps? no 4. Diabetes Mellitus? yes (type 2 DM, taking metformin and glipizide) 5. Joint replacements in the past 12 months?no 6. Major health problems in the past 3 months?no 7. Any artificial heart valves, MVP, or defibrillator?no    MEDICATIONS & ALLERGIES:    Patient reports the following regarding taking any anticoagulation/antiplatelet therapy:   Plavix, Coumadin, Eliquis, Xarelto, Lovenox, Pradaxa, Brilinta, or Effient? no Aspirin? no  Patient confirms/reports the following medications:  Current Outpatient Medications  Medication Sig Dispense Refill   albuterol (PROVENTIL HFA;VENTOLIN HFA) 108 (90 BASE) MCG/ACT inhaler Inhale 2 puffs into the lungs every 6 (six) hours as needed for wheezing or shortness of breath.     allopurinol (ZYLOPRIM) 100 MG tablet Take 100 mg by mouth daily.     aspirin 81 MG tablet Take 81 mg by mouth daily.     BIOTIN PO Take 500 mcg by mouth daily.     Calcium Carb-Cholecalciferol 600-200 MG-UNIT TABS Take 1 tablet by mouth 2 (two) times daily.     carvedilol (COREG) 12.5 MG tablet TAKE 1 TABLET BY MOUTH TWICE A DAY WITH A MEAL 60 tablet 6   citalopram (CELEXA) 20 MG tablet Take 20 mg by mouth daily.     colchicine 0.6 MG tablet Take 2 tablets by mouth. Wait one hour and take one more tablet. On days 2-5, take one tablet for gout. (Patient not taking: Reported on 12/22/2021) 7 tablet 0   diclofenac Sodium (VOLTAREN) 1 % GEL Apply 2 g topically 4 (four) times daily as needed. Apply to outside of right shoulder at painful area. 100 g 0   ENTRESTO 24-26 MG TAKE 1 TABLET BY MOUTH TWICE A DAY 60  tablet 5   fluticasone (FLONASE) 50 MCG/ACT nasal spray Place 2 sprays into both nostrils daily.     fluticasone (FLOVENT HFA) 110 MCG/ACT inhaler Inhale 2 puffs into the lungs 2 (two) times daily.     furosemide (LASIX) 20 MG tablet Take 20 mg by mouth daily.     gabapentin (NEURONTIN) 300 MG capsule Take 300 mg by mouth at bedtime.     glipiZIDE (GLUCOTROL XL) 5 MG 24 hr tablet Take 5 mg by mouth daily with breakfast.     loratadine (CLARITIN) 10 MG tablet Take 10 mg by mouth daily.     magnesium oxide (MAG-OX) 400 MG tablet Take 400 mg daily by mouth.     metFORMIN (GLUCOPHAGE) 500 MG tablet Take 1,000 mg by mouth 2 (two) times daily with a meal.      Multiple Vitamin (MULTIVITAMIN) capsule Take 1 capsule daily by mouth.     Omega 3 1000 MG CAPS Take 1 capsule by mouth daily.     omeprazole (PRILOSEC) 20 MG capsule Take 20 mg by mouth 2 (two) times daily before a meal.     oxybutynin (DITROPAN) 5 MG tablet Take 5 mg by mouth 2 (two) times daily.     simvastatin (ZOCOR) 40 MG tablet Take 40 mg by mouth daily.     spironolactone (ALDACTONE) 25 MG tablet Take 25 mg by mouth daily.     traZODone (DESYREL) 50 MG  tablet Take 50 mg by mouth at bedtime.     vitamin C (ASCORBIC ACID) 500 MG tablet Take 500 mg by mouth daily.     No current facility-administered medications for this visit.    Patient confirms/reports the following allergies:  Allergies  Allergen Reactions   Bee Venom Swelling   Sulfa Antibiotics Hives   Vicodin [Hydrocodone-Acetaminophen] Nausea And Vomiting   Latex Itching and Rash   Penicillins Itching    No orders of the defined types were placed in this encounter.   AUTHORIZATION INFORMATION Primary Insurance: 1D#: Group #:  Secondary Insurance: 1D#: Group #:  SCHEDULE INFORMATION: Date: 12/31/2022 Time: Location: [place]

## 2022-12-17 NOTE — Progress Notes (Deleted)
Patient ID: Melissa Harrell, female    DOB: 1961-08-13, 62 y.o.   MRN: [id number]  HPI  Ms Melissa Harrell is a 62 year old female with a history of morbid obesity, COPD, diabetes, HTN, pulmonary emboli and systolic heart failure who returns today for a f/u visit.   Echo report from 05/16/21 reviewed and showed an EF of 65-70% along with moderate LVH and mild MR. Last echo done 02/08/17 which showed an EF of 50% along with mild MR/TR. Previous echo done 03/14/15 with an EF of 45% without any stenosis or regurgitation.   Has not been admitted or been in the ED in the last 6 months.   Returns today for a follow-up visit with a chief complaint of   Past Medical History:  Diagnosis Date   Anxiety    Arthritis    Asthma    Breast cancer ([place]) [place]:[phone]   right    CHF (congestive heart failure) ([place])    COPD (chronic obstructive pulmonary disease) ([place])    Depression    Diabetes mellitus without complication (HCC)    GERD (gastroesophageal reflux disease)    Hyperlipidemia    Hypertension    Personal history of chemotherapy    Personal history of radiation therapy    Pulmonary emboli [facility])    Past Surgical History:  Procedure Laterality Date   ABDOMINAL HYSTERECTOMY     BREAST BIOPSY Right 2006   positive   BREAST EXCISIONAL BIOPSY Right 2003   positive, chemo and radiation   COLONOSCOPY WITH PROPOFOL N/A 12/30/2017   Procedure: COLONOSCOPY WITH PROPOFOL;  Surgeon: Jonathon Bellows, MD;  Location: [facility] ENDOSCOPY;  Service: Gastroenterology;  Laterality: N/A;   MASTECTOMY Right 2006   TUBAL LIGATION Bilateral    Family History  Problem Relation Age of Onset   Cancer - Cervical Maternal Grandmother    Heart disease Mother    Heart failure Sister    Breast cancer Cousin    Social History   Tobacco Use   Smoking status: Never   Smokeless tobacco: Never  Substance Use Topics   Alcohol use: No    Alcohol/week: 0.0 standard drinks of alcohol   Allergies  Allergen Reactions   Bee Venom  Swelling   Sulfa Antibiotics Hives   Vicodin [Hydrocodone-Acetaminophen] Nausea And Vomiting   Latex Itching and Rash   Penicillins Itching    Review of Systems  Constitutional:  Negative for appetite change and fatigue.  HENT:  Negative for congestion, postnasal drip and sore throat.   Eyes: Negative.   Respiratory:  Negative for cough, chest tightness and shortness of breath.   Cardiovascular:  Negative for chest pain, palpitations and leg swelling.  Gastrointestinal:  Negative for abdominal distention and abdominal pain.  Endocrine: Negative.   Genitourinary: Negative.   Musculoskeletal:  Positive for arthralgias (shoulders/knees).  Skin: Negative.   Allergic/Immunologic: Negative.   Neurological:  Negative for dizziness and light-headedness.  Hematological:  Negative for adenopathy. Does not bruise/bleed easily.  Psychiatric/Behavioral:  Negative for dysphoric mood and sleep disturbance (sleeping on 1 pillow). The patient is not nervous/anxious.      Physical Exam Vitals reviewed.  Constitutional:      Appearance: Normal appearance.  HENT:     Head: Normocephalic and atraumatic.  Cardiovascular:     Rate and Rhythm: Normal rate and regular rhythm.  Pulmonary:     Effort: Pulmonary effort is normal. No respiratory distress.     Breath sounds: No wheezing or rales.  Abdominal:  General: There is no distension.     Palpations: Abdomen is soft.     Tenderness: There is no abdominal tenderness.  Musculoskeletal:     Cervical back: Normal range of motion and neck supple.     Right lower leg: Edema (trace pitting) present.     Left lower leg: Edema (trace pitting) present.  Skin:    General: Skin is warm and dry.  Neurological:     General: No focal deficit present.     Mental Status: She is alert and oriented to person, place, and time.  Psychiatric:        Mood and Affect: Mood normal.        Behavior: Behavior normal.        Thought Content: Thought content  normal.   Assessment & Plan  1: Chronic heart failure with preserved ejection fraction-  - NYHA Class I today - euvolemic today - weighing daily and she was reminded to call for an overnight weight gain of >2 pounds or weekly weight gain of >5 pounds.  - weight 265.2 pounds from last visit here 6 months ago - not adding salt and reading food labels for sodium content - saw cardiologist Melissa Harrell) 07/24/22 - on GDMT of entresto - tried to take empagliflozin which caused yeast infections  2: Hypotension- - BP  - saw Melissa Harrell) 07/10/22 - BMP 04/17/22 reviewed and showed sodium 139, potassium 4.5, creatinine 1.1 and GFR 61  3: Diabetes- - PCP follows her for her diabetes - glucose at home today was    Patient did not bring her medications nor a list. Each medication was verbally reviewed with the patient and she was encouraged to bring the bottles to every visit to confirm accuracy of list..

## 2022-12-18 ENCOUNTER — Telehealth: Payer: Self-pay | Admitting: *Deleted

## 2022-12-18 NOTE — Telephone Encounter (Signed)
Received fax from patient's cardiology.  Patient is clear to have the procedure and should stop taking aspirin 3 days prior and 1 day to restart.  Patient verbalized understanding.  Will call next to remind patient regarding aspirin.

## 2022-12-19 ENCOUNTER — Ambulatory Visit: Payer: Medicare Other | Admitting: Family

## 2022-12-27 NOTE — Telephone Encounter (Signed)
Called patient this morning. I have notified patient again to stop her aspirin for the next 3 days and don't restart until 1 day after procedure on Monday.  Patient verbalized understanding.

## 2022-12-31 ENCOUNTER — Ambulatory Visit: Payer: 59 | Admitting: General Practice

## 2022-12-31 ENCOUNTER — Encounter
Admission: RE | Disposition: A | Discharge status: Home / Self Care | Payer: Self-pay | Source: Home / Self Care | Attending: Gastroenterology

## 2022-12-31 ENCOUNTER — Ambulatory Visit
Admission: RE | Admit: 2022-12-31 | Discharge: 2022-12-31 | Disposition: A | Discharge status: Home / Self Care | Payer: 59 | Attending: Gastroenterology | Admitting: Gastroenterology

## 2022-12-31 DIAGNOSIS — K5730 Diverticulosis of large intestine without perforation or abscess without bleeding: Secondary | ICD-10-CM | POA: Diagnosis not present

## 2022-12-31 DIAGNOSIS — Z6841 Body Mass Index (BMI) 40.0 and over, adult: Secondary | ICD-10-CM | POA: Diagnosis not present

## 2022-12-31 DIAGNOSIS — Z860100 Personal history of colon polyps, unspecified: Secondary | ICD-10-CM

## 2022-12-31 DIAGNOSIS — K219 Gastro-esophageal reflux disease without esophagitis: Secondary | ICD-10-CM | POA: Diagnosis not present

## 2022-12-31 DIAGNOSIS — E119 Type 2 diabetes mellitus without complications: Secondary | ICD-10-CM | POA: Diagnosis not present

## 2022-12-31 DIAGNOSIS — I509 Heart failure, unspecified: Secondary | ICD-10-CM | POA: Insufficient documentation

## 2022-12-31 DIAGNOSIS — Z1211 Encounter for screening for malignant neoplasm of colon: Secondary | ICD-10-CM | POA: Diagnosis present

## 2022-12-31 DIAGNOSIS — D124 Benign neoplasm of descending colon: Secondary | ICD-10-CM | POA: Diagnosis not present

## 2022-12-31 DIAGNOSIS — D126 Benign neoplasm of colon, unspecified: Secondary | ICD-10-CM | POA: Diagnosis not present

## 2022-12-31 DIAGNOSIS — I110 Hypertensive heart disease with heart failure: Secondary | ICD-10-CM | POA: Insufficient documentation

## 2022-12-31 HISTORY — PX: COLONOSCOPY WITH PROPOFOL: SHX5780

## 2022-12-31 LAB — GLUCOSE, CAPILLARY: Glucose-Capillary: 94 mg/dL (ref 70–99)

## 2022-12-31 SURGERY — COLONOSCOPY WITH PROPOFOL
Anesthesia: General

## 2022-12-31 MED ORDER — PROPOFOL 1000 MG/100ML IV EMUL
INTRAVENOUS | Status: AC
  Filled 2022-12-31: qty 500

## 2022-12-31 MED ORDER — PROPOFOL 10 MG/ML IV BOLUS
INTRAVENOUS | Status: DC | PRN
  Administered 2022-12-31: 70 mg via INTRAVENOUS

## 2022-12-31 MED ORDER — PROPOFOL 500 MG/50ML IV EMUL
INTRAVENOUS | Status: DC | PRN
  Administered 2022-12-31: 165 ug/kg/min via INTRAVENOUS

## 2022-12-31 MED ORDER — SODIUM CHLORIDE 0.9 % IV SOLN
INTRAVENOUS | Status: DC
  Administered 2022-12-31: 20 mL/h via INTRAVENOUS

## 2022-12-31 MED ORDER — LIDOCAINE HCL (CARDIAC) PF 100 MG/5ML IV SOSY
PREFILLED_SYRINGE | INTRAVENOUS | Status: DC | PRN
  Administered 2022-12-31: 100 mg via INTRAVENOUS

## 2022-12-31 NOTE — Anesthesia Postprocedure Evaluation (Signed)
Anesthesia Post Note  Patient: Melissa Harrell  Procedure(s) Performed: COLONOSCOPY WITH PROPOFOL  Patient location during evaluation: Endoscopy Anesthesia Type: General Level of consciousness: awake and alert Pain management: pain level controlled Vital Signs Assessment: post-procedure vital signs reviewed and stable Respiratory status: spontaneous breathing, nonlabored ventilation, respiratory function stable and patient connected to nasal cannula oxygen Cardiovascular status: blood pressure returned to baseline and stable Postop Assessment: no apparent nausea or vomiting Anesthetic complications: no  No notable events documented.   Last Vitals:  Vitals:   12/31/22 0820 12/31/22 0830  BP: 106/66 (!) 133/91  Pulse: 78 84  Resp: 13 15  Temp: (!) 35.8 C   SpO2: 100% 100%    Last Pain:  Vitals:   12/31/22 0830  TempSrc:   PainSc: 0-No pain                 Dimas Millin

## 2022-12-31 NOTE — Op Note (Signed)
[facility] Patient Name: Melissa Harrell Procedure Date: 12/31/2022 7:20 AM MRN: [id number] Account #: [id number] Date of Birth: Aug 12, 1961 Admit Type: Outpatient Age: 62 Room: [facility] ENDO ROOM 1 Gender: Female Note Status: Finalized Instrument Name: Park Meo [id number] Procedure:             Colonoscopy Indications:           Surveillance: Personal history of adenomatous polyps                         on last colonoscopy 5 years ago Providers:             Jonathon Bellows MD, MD Referring MD:          Edmonia Lynch. Aycock MD (Referring MD) Medicines:             Monitored Anesthesia Care Complications:         No immediate complications. Procedure:             Pre-Anesthesia Assessment:                        - Prior to the procedure, a History and Physical was                         performed, and patient medications, allergies and                         sensitivities were reviewed. The patient's tolerance                         of previous anesthesia was reviewed.                        - The risks and benefits of the procedure and the                         sedation options and risks were discussed with the                         patient. All questions were answered and informed                         consent was obtained.                        - ASA Grade Assessment: II - A patient with mild                         systemic disease.                        After obtaining informed consent, the colonoscope was                         passed under direct vision. Throughout the procedure,                         the patient's blood pressure, pulse, and oxygen  saturations were monitored continuously. The                         Colonoscope was introduced through the anus and                         advanced to the the cecum, identified by the                         appendiceal orifice. The colonoscopy was performed                          without difficulty. The patient tolerated the                         procedure well. The quality of the bowel preparation                         was good. The ileocecal valve, appendiceal orifice,                         and rectum were photographed. Findings:      The perianal and digital rectal examinations were normal.      Two sessile polyps were found in the descending colon. The polyps were 5       to 7 mm in size. These polyps were removed with a cold snare. Resection       and retrieval were complete.      Multiple diverticula were found in the entire colon.      The exam was otherwise without abnormality on direct and retroflexion       views. Impression:            - Two 5 to 7 mm polyps in the descending colon,                         removed with a cold snare. Resected and retrieved.                        - Diverticulosis in the entire examined colon.                        - The examination was otherwise normal on direct and                         retroflexion views. Recommendation:        - Discharge patient to home (with escort).                        - Resume previous diet.                        - Continue present medications.                        - Await pathology results.                        - Repeat colonoscopy for surveillance based on  pathology results. Procedure Code(s):     --- Professional ---                        [phone], Colonoscopy, flexible; with removal of                         tumor(s), polyp(s), or other lesion(s) by snare                         technique Diagnosis Code(s):     --- Professional ---                        Z86.010, Personal history of colonic polyps                        D12.4, Benign neoplasm of descending colon                        K57.30, Diverticulosis of large intestine without                         perforation or abscess without bleeding CPT copyright 2022 American Medical Association.  All rights reserved. The codes documented in this report are preliminary and upon coder review may  be revised to meet current compliance requirements. Jonathon Bellows, MD Jonathon Bellows MD, MD 12/31/2022 8:18:50 AM This report has been signed electronically. Number of Addenda: 0 Note Initiated On: 12/31/2022 7:20 AM Scope Withdrawal Time: 0 hours 9 minutes 22 seconds  Total Procedure Duration: 0 hours 11 minutes 47 seconds  Estimated Blood Loss:  Estimated blood loss: none.      [facility]

## 2022-12-31 NOTE — Anesthesia Procedure Notes (Signed)
Procedure Name: General with mask airway Date/Time: 12/31/2022 8:10 AM  Performed by: Kelton Pillar, CRNAPre-anesthesia Checklist: Patient identified, Emergency Drugs available, Suction available and Patient being monitored Patient Re-evaluated:Patient Re-evaluated prior to induction Oxygen Delivery Method: Simple face mask Induction Type: IV induction Placement Confirmation: positive ETCO2, CO2 detector and breath sounds checked- equal and bilateral Dental Injury: Teeth and Oropharynx as per pre-operative assessment

## 2022-12-31 NOTE — H&P (Signed)
Jonathon Bellows, MD [address] [place], [place], [place], [address] [address], [place], [place], [place], [address] Phone: [phone]  Fax: [phone]  Primary Care Physician:  Donnie Coffin, MD   Pre-Procedure History & Physical: HPI:  Melissa Harrell is a 62 y.o. female is here for an colonoscopy.   Past Medical History:  Diagnosis Date   Anxiety    Arthritis    Asthma    Breast cancer ([place]) [phone]   right    CHF (congestive heart failure) (HCC)    COPD (chronic obstructive pulmonary disease) (HCC)    Depression    Diabetes mellitus without complication (HCC)    GERD (gastroesophageal reflux disease)    Hyperlipidemia    Hypertension    Personal history of chemotherapy    Personal history of radiation therapy    Pulmonary emboli [facility])     Past Surgical History:  Procedure Laterality Date   ABDOMINAL HYSTERECTOMY     BREAST BIOPSY Right 2006   positive   BREAST EXCISIONAL BIOPSY Right 2003   positive, chemo and radiation   COLONOSCOPY WITH PROPOFOL N/A 12/30/2017   Procedure: COLONOSCOPY WITH PROPOFOL;  Surgeon: Jonathon Bellows, MD;  Location: [facility] ENDOSCOPY;  Service: Gastroenterology;  Laterality: N/A;   MASTECTOMY Right 2006   TUBAL LIGATION Bilateral     Prior to Admission medications   Medication Sig Start Date End Date Taking? Authorizing Provider  albuterol (PROVENTIL HFA;VENTOLIN HFA) 108 (90 BASE) MCG/ACT inhaler Inhale 2 puffs into the lungs every 6 (six) hours as needed for wheezing or shortness of breath.   Yes [provider]  allopurinol (ZYLOPRIM) 100 MG tablet Take 100 mg by mouth daily.   Yes [provider]  aspirin 81 MG tablet Take 81 mg by mouth daily.   Yes [provider]  BIOTIN PO Take 500 mcg by mouth daily.   Yes [provider]  Calcium Carb-Cholecalciferol 600-200 MG-UNIT TABS Take 1 tablet by mouth 2 (two) times daily.   Yes [provider]  carvedilol (COREG) 12.5 MG tablet TAKE 1  TABLET BY MOUTH TWICE A DAY WITH A MEAL 06/13/22  Yes Hackney, Tina A, FNP  citalopram (CELEXA) 20 MG tablet Take 20 mg by mouth daily.   Yes [provider]  diclofenac Sodium (VOLTAREN) 1 % GEL Apply 2 g topically 4 (four) times daily as needed. Apply to outside of right shoulder at painful area. 06/06/21  Yes Carrie Mew, MD  ENTRESTO 24-26 MG TAKE 1 TABLET BY MOUTH TWICE A DAY 06/13/22  Yes Darylene Price A, FNP  fluticasone (FLONASE) 50 MCG/ACT nasal spray Place 2 sprays into both nostrils daily.   Yes [provider]  fluticasone (FLOVENT HFA) 110 MCG/ACT inhaler Inhale 2 puffs into the lungs 2 (two) times daily.   Yes [provider]  furosemide (LASIX) 20 MG tablet Take 20 mg by mouth daily.   Yes [provider]  gabapentin (NEURONTIN) 300 MG capsule Take 300 mg by mouth at bedtime.   Yes [provider]  glipiZIDE (GLUCOTROL XL) 5 MG 24 hr tablet Take 5 mg by mouth daily with breakfast.   Yes [provider]  loratadine (CLARITIN) 10 MG tablet Take 10 mg by mouth daily.   Yes [provider]  magnesium oxide (MAG-OX) 400 MG tablet Take 400 mg daily by mouth.   Yes [provider]  metFORMIN (GLUCOPHAGE) 500 MG tablet Take 1,000 mg by mouth 2 (two) times daily with  a meal.    Yes [provider]  Multiple Vitamin (MULTIVITAMIN) capsule Take 1 capsule daily by mouth.   Yes [provider]  Omega 3 1000 MG CAPS Take 1 capsule by mouth daily.   Yes [provider]  omeprazole (PRILOSEC) 20 MG capsule Take 20 mg by mouth 2 (two) times daily before a meal.   Yes [provider]  oxybutynin (DITROPAN) 5 MG tablet Take 5 mg by mouth 2 (two) times daily.   Yes [provider]  polyethylene glycol (GOLYTELY) 236 g solution At 5:00 pm the evening before procedure You will need to fill to the line indicated on the container with a clear liquid. You will need to mix very well and drink 8  ounces every 15-20 minutes until (1/2) of the liquid in container has been completed.On the day of procedure, 5 hours before the scheduled time You will need to drink the rest of the liquid as instructed previously, 8 ounces every 15 minutes until finished within 2 hours. 10/10/22  Yes Jonathon Bellows, MD  simvastatin (ZOCOR) 40 MG tablet Take 40 mg by mouth daily.   Yes [provider]  spironolactone (ALDACTONE) 25 MG tablet Take 25 mg by mouth daily.   Yes [provider]  traZODone (DESYREL) 50 MG tablet Take 50 mg by mouth at bedtime.   Yes [provider]  vitamin C (ASCORBIC ACID) 500 MG tablet Take 500 mg by mouth daily.   Yes [provider]  colchicine 0.6 MG tablet Take 2 tablets by mouth. Wait one hour and take one more tablet. On days 2-5, take one tablet for gout. Patient not taking: Reported on 12/22/2021 12/04/21   Vladimir Crofts, MD    Allergies as of 10/10/2022 - Review Complete 06/20/2022  Allergen Reaction Noted   Bee venom Swelling 03/25/2015   Sulfa antibiotics Hives 03/25/2015   Vicodin [hydrocodone-acetaminophen] Nausea And Vomiting 03/25/2015   Latex Itching and Rash 03/25/2015   Penicillins Itching 03/25/2015    Family History  Problem Relation Age of Onset   Cancer - Cervical Maternal Grandmother    Heart disease Mother    Heart failure Sister    Breast cancer Cousin     Social History   Socioeconomic History   Marital status: Divorced    Spouse name: Not on file   Number of children: Not on file   Years of education: Not on file   Highest education level: Not on file  Occupational History   Occupation: unemployed  Tobacco Use   Smoking status: Never   Smokeless tobacco: Never  Substance and Sexual Activity   Alcohol use: No    Alcohol/week: 0.0 standard drinks of alcohol   Drug use: No   Sexual activity: Yes  Other Topics Concern   Not on file  Social History Narrative   Not on file   Social Determinants of Health    Financial Resource Strain: Not on file  Food Insecurity: Not on file  Transportation Needs: Not on file  Physical Activity: Not on file  Stress: Not on file  Social Connections: Not on file  Intimate Partner Violence: Not on file    Review of Systems: See HPI, otherwise negative ROS  Physical Exam: BP (!) 129/51   Pulse 83   Temp (!) 96.5 F (35.8 C) (Temporal)   Resp 20   Ht '5\' 1"'$  (1.549 m)   Wt 118.4 kg   LMP  (LMP Unknown)   SpO2 100%  BMI 49.32 kg/m  General:   Alert,  pleasant and cooperative in NAD Head:  Normocephalic and atraumatic. Neck:  Supple; no masses or thyromegaly. Lungs:  Clear throughout to auscultation, normal respiratory effort.    Heart:  +S1, +S2, Regular rate and rhythm, No edema. Abdomen:  Soft, nontender and nondistended. Normal bowel sounds, without guarding, and without rebound.   Neurologic:  Alert and  oriented x4;  grossly normal neurologically.  Impression/Plan: Melissa Harrell is here for an colonoscopy to be performed for surveillance due to prior history of colon polyps   Risks, benefits, limitations, and alternatives regarding  colonoscopy have been reviewed with the patient.  Questions have been answered.  All parties agreeable.   Jonathon Bellows, MD  12/31/2022, 7:56 AM

## 2022-12-31 NOTE — Anesthesia Preprocedure Evaluation (Signed)
Anesthesia Evaluation  Patient identified by MRN, date of birth, ID band Patient awake    Reviewed: Allergy & Precautions, NPO status , Patient's Chart, lab work & pertinent test results, reviewed documented beta blocker date and time   History of Anesthesia Complications Negative for: history of anesthetic complications  Airway Mallampati: III  TM Distance: >3 FB     Dental  (+) Chipped, Missing, Poor Dentition   Pulmonary asthma , pneumonia, neg COPD          Cardiovascular hypertension, Pt. on medications and Pt. on home beta blockers +CHF       Neuro/Psych  PSYCHIATRIC DISORDERS Anxiety Depression     Neuromuscular disease    GI/Hepatic ,GERD  ,,  Endo/Other  diabetes, Type 2  Morbid obesity  Renal/GU      Musculoskeletal  (+) Arthritis ,    Abdominal   Peds  Hematology   Anesthesia Other Findings   Reproductive/Obstetrics                             Anesthesia Physical Anesthesia Plan  ASA: 3  Anesthesia Plan: General   Post-op Pain Management: Minimal or no pain anticipated   Induction: Intravenous  PONV Risk Score and Plan: 3 and Propofol infusion, TIVA and Ondansetron  Airway Management Planned: Nasal Cannula  Additional Equipment: None  Intra-op Plan:   Post-operative Plan:   Informed Consent: I have reviewed the patients History and Physical, chart, labs and discussed the procedure including the risks, benefits and alternatives for the proposed anesthesia with the patient or authorized representative who has indicated his/her understanding and acceptance.     Dental advisory given  Plan Discussed with: CRNA  Anesthesia Plan Comments: (Discussed risks of anesthesia with patient, including possibility of difficulty with spontaneous ventilation under anesthesia necessitating airway intervention, PONV, and rare risks such as cardiac or respiratory or neurological  events, and allergic reactions. Discussed the role of CRNA in patient's perioperative care. Patient understands.)        Anesthesia Quick Evaluation

## 2022-12-31 NOTE — Transfer of Care (Signed)
Immediate Anesthesia Transfer of Care Note  Patient: Melissa Harrell  Procedure(s) Performed: COLONOSCOPY WITH PROPOFOL  Patient Location: Endoscopy Unit  Anesthesia Type:General  Level of Consciousness: drowsy and patient cooperative  Airway & Oxygen Therapy: Patient Spontanous Breathing and Patient connected to face mask oxygen  Post-op Assessment: Report given to RN and Post -op Vital signs reviewed and stable  Post vital signs: Reviewed and stable  Last Vitals:  Vitals Value Taken Time  BP 106/66 12/31/22 0820  Temp 35.8 C 12/31/22 0820  Pulse 78 12/31/22 0822  Resp 19 12/31/22 0822  SpO2 100 % 12/31/22 0822  Vitals shown include unvalidated device data.  Last Pain:  Vitals:   12/31/22 0820  TempSrc: Tympanic  PainSc: Asleep         Complications: No notable events documented.

## 2023-01-01 ENCOUNTER — Encounter: Payer: Self-pay | Admitting: Gastroenterology

## 2023-01-01 LAB — SURGICAL PATHOLOGY

## 2023-01-02 ENCOUNTER — Encounter: Payer: Self-pay | Admitting: Gastroenterology

## 2023-02-12 ENCOUNTER — Encounter (INDEPENDENT_AMBULATORY_CARE_PROVIDER_SITE_OTHER): Payer: 59 | Admitting: Ophthalmology

## 2023-02-12 DIAGNOSIS — H33302 Unspecified retinal break, left eye: Secondary | ICD-10-CM | POA: Diagnosis not present

## 2023-02-12 DIAGNOSIS — H35033 Hypertensive retinopathy, bilateral: Secondary | ICD-10-CM

## 2023-02-12 DIAGNOSIS — I10 Essential (primary) hypertension: Secondary | ICD-10-CM | POA: Diagnosis not present

## 2023-02-12 DIAGNOSIS — H43813 Vitreous degeneration, bilateral: Secondary | ICD-10-CM

## 2023-02-12 DIAGNOSIS — H33011 Retinal detachment with single break, right eye: Secondary | ICD-10-CM

## 2023-02-22 ENCOUNTER — Encounter (INDEPENDENT_AMBULATORY_CARE_PROVIDER_SITE_OTHER): Payer: 59 | Admitting: Ophthalmology

## 2023-02-22 DIAGNOSIS — H33302 Unspecified retinal break, left eye: Secondary | ICD-10-CM

## 2023-03-08 ENCOUNTER — Encounter (INDEPENDENT_AMBULATORY_CARE_PROVIDER_SITE_OTHER): Payer: 59 | Admitting: Ophthalmology

## 2023-03-08 DIAGNOSIS — H33303 Unspecified retinal break, bilateral: Secondary | ICD-10-CM

## 2023-07-12 ENCOUNTER — Encounter (INDEPENDENT_AMBULATORY_CARE_PROVIDER_SITE_OTHER): Payer: 59 | Admitting: Ophthalmology

## 2023-07-12 DIAGNOSIS — H35033 Hypertensive retinopathy, bilateral: Secondary | ICD-10-CM

## 2023-07-12 DIAGNOSIS — H43813 Vitreous degeneration, bilateral: Secondary | ICD-10-CM

## 2023-07-12 DIAGNOSIS — I10 Essential (primary) hypertension: Secondary | ICD-10-CM

## 2023-07-12 DIAGNOSIS — H2513 Age-related nuclear cataract, bilateral: Secondary | ICD-10-CM

## 2023-07-12 DIAGNOSIS — H33303 Unspecified retinal break, bilateral: Secondary | ICD-10-CM

## 2023-09-04 ENCOUNTER — Other Ambulatory Visit: Payer: Self-pay | Admitting: Family Medicine

## 2023-09-04 DIAGNOSIS — Z1231 Encounter for screening mammogram for malignant neoplasm of breast: Secondary | ICD-10-CM

## 2023-10-08 ENCOUNTER — Ambulatory Visit
Admission: RE | Admit: 2023-10-08 | Discharge: 2023-10-08 | Disposition: A | Discharge status: Home / Self Care | Payer: 59 | Source: Ambulatory Visit | Attending: Family Medicine | Admitting: Family Medicine

## 2023-10-08 DIAGNOSIS — Z1231 Encounter for screening mammogram for malignant neoplasm of breast: Secondary | ICD-10-CM | POA: Diagnosis present

## 2024-08-04 ENCOUNTER — Other Ambulatory Visit: Payer: Self-pay | Admitting: Family Medicine

## 2024-08-04 DIAGNOSIS — Z1231 Encounter for screening mammogram for malignant neoplasm of breast: Secondary | ICD-10-CM

## 2024-10-09 ENCOUNTER — Ambulatory Visit
Admission: RE | Admit: 2024-10-09 | Discharge: 2024-10-09 | Disposition: A | Discharge status: Home / Self Care | Source: Ambulatory Visit | Attending: Family Medicine | Admitting: Family Medicine

## 2024-10-09 DIAGNOSIS — Z1231 Encounter for screening mammogram for malignant neoplasm of breast: Secondary | ICD-10-CM | POA: Diagnosis present
